# Patient Record
Sex: Female | Born: 1980 | Race: Black or African American | Hispanic: No | Marital: Single | Smoking: Former smoker
Health system: Southern US, Community
[De-identification: ages and names within clinical notes are randomized; demographics above are authoritative.]

## PROBLEM LIST (undated history)

## (undated) ENCOUNTER — Emergency Department (HOSPITAL_COMMUNITY): Payer: Self-pay

## (undated) ENCOUNTER — Ambulatory Visit (HOSPITAL_COMMUNITY): Admission: EM | Payer: Medicaid Other

## (undated) DIAGNOSIS — R600 Localized edema: Secondary | ICD-10-CM

## (undated) DIAGNOSIS — N946 Dysmenorrhea, unspecified: Secondary | ICD-10-CM

## (undated) DIAGNOSIS — R0602 Shortness of breath: Secondary | ICD-10-CM

## (undated) DIAGNOSIS — N809 Endometriosis, unspecified: Secondary | ICD-10-CM

## (undated) DIAGNOSIS — M549 Dorsalgia, unspecified: Secondary | ICD-10-CM

## (undated) DIAGNOSIS — E559 Vitamin D deficiency, unspecified: Secondary | ICD-10-CM

## (undated) DIAGNOSIS — L409 Psoriasis, unspecified: Secondary | ICD-10-CM

## (undated) DIAGNOSIS — F419 Anxiety disorder, unspecified: Secondary | ICD-10-CM

## (undated) DIAGNOSIS — K219 Gastro-esophageal reflux disease without esophagitis: Secondary | ICD-10-CM

## (undated) DIAGNOSIS — E282 Polycystic ovarian syndrome: Secondary | ICD-10-CM

## (undated) DIAGNOSIS — J45909 Unspecified asthma, uncomplicated: Secondary | ICD-10-CM

## (undated) DIAGNOSIS — G4730 Sleep apnea, unspecified: Secondary | ICD-10-CM

## (undated) DIAGNOSIS — N979 Female infertility, unspecified: Secondary | ICD-10-CM

## (undated) DIAGNOSIS — D649 Anemia, unspecified: Secondary | ICD-10-CM

## (undated) DIAGNOSIS — F32A Depression, unspecified: Secondary | ICD-10-CM

## (undated) DIAGNOSIS — E663 Overweight: Secondary | ICD-10-CM

## (undated) DIAGNOSIS — K760 Fatty (change of) liver, not elsewhere classified: Secondary | ICD-10-CM

## (undated) DIAGNOSIS — D509 Iron deficiency anemia, unspecified: Secondary | ICD-10-CM

## (undated) HISTORY — DX: Dorsalgia, unspecified: M54.9

## (undated) HISTORY — DX: Gastro-esophageal reflux disease without esophagitis: K21.9

## (undated) HISTORY — DX: Shortness of breath: R06.02

## (undated) HISTORY — DX: Psoriasis, unspecified: L40.9

## (undated) HISTORY — DX: Localized edema: R60.0

## (undated) HISTORY — DX: Vitamin D deficiency, unspecified: E55.9

## (undated) HISTORY — DX: Anxiety disorder, unspecified: F41.9

## (undated) HISTORY — DX: Overweight: E66.3

## (undated) HISTORY — DX: Anemia, unspecified: D64.9

## (undated) HISTORY — DX: Sleep apnea, unspecified: G47.30

## (undated) HISTORY — DX: Depression, unspecified: F32.A

## (undated) HISTORY — PX: TONSILLECTOMY: SUR1361

## (undated) HISTORY — DX: Fatty (change of) liver, not elsewhere classified: K76.0

## (undated) HISTORY — PX: WISDOM TOOTH EXTRACTION: SHX21

## (undated) HISTORY — DX: Female infertility, unspecified: N97.9

## (undated) HISTORY — PX: APPENDECTOMY: SHX54

---

## 2015-09-23 HISTORY — PX: LAPAROSCOPIC GASTRIC BYPASS: SUR771

## 2017-09-22 HISTORY — PX: ABLATION ON ENDOMETRIOSIS: SHX5787

## 2019-09-16 ENCOUNTER — Inpatient Hospital Stay (HOSPITAL_COMMUNITY)
Admission: EM | Admit: 2019-09-16 | Discharge: 2019-09-18 | DRG: 312 | Disposition: A | Discharge status: Home / Self Care | Payer: Medicaid Other | Attending: Internal Medicine | Admitting: Internal Medicine

## 2019-09-16 ENCOUNTER — Encounter (HOSPITAL_COMMUNITY): Payer: Self-pay | Admitting: Internal Medicine

## 2019-09-16 ENCOUNTER — Emergency Department (HOSPITAL_COMMUNITY): Payer: Medicaid Other

## 2019-09-16 ENCOUNTER — Other Ambulatory Visit: Payer: Self-pay

## 2019-09-16 DIAGNOSIS — E042 Nontoxic multinodular goiter: Secondary | ICD-10-CM | POA: Diagnosis present

## 2019-09-16 DIAGNOSIS — N809 Endometriosis, unspecified: Secondary | ICD-10-CM | POA: Diagnosis present

## 2019-09-16 DIAGNOSIS — Z87891 Personal history of nicotine dependence: Secondary | ICD-10-CM

## 2019-09-16 DIAGNOSIS — I959 Hypotension, unspecified: Secondary | ICD-10-CM

## 2019-09-16 DIAGNOSIS — E669 Obesity, unspecified: Secondary | ICD-10-CM | POA: Diagnosis present

## 2019-09-16 DIAGNOSIS — R001 Bradycardia, unspecified: Secondary | ICD-10-CM | POA: Diagnosis present

## 2019-09-16 DIAGNOSIS — E282 Polycystic ovarian syndrome: Secondary | ICD-10-CM | POA: Diagnosis present

## 2019-09-16 DIAGNOSIS — R011 Cardiac murmur, unspecified: Secondary | ICD-10-CM | POA: Diagnosis present

## 2019-09-16 DIAGNOSIS — R569 Unspecified convulsions: Secondary | ICD-10-CM | POA: Diagnosis present

## 2019-09-16 DIAGNOSIS — R0989 Other specified symptoms and signs involving the circulatory and respiratory systems: Secondary | ICD-10-CM | POA: Diagnosis present

## 2019-09-16 DIAGNOSIS — L409 Psoriasis, unspecified: Secondary | ICD-10-CM | POA: Diagnosis present

## 2019-09-16 DIAGNOSIS — J45909 Unspecified asthma, uncomplicated: Secondary | ICD-10-CM | POA: Diagnosis present

## 2019-09-16 DIAGNOSIS — R55 Syncope and collapse: Secondary | ICD-10-CM | POA: Diagnosis present

## 2019-09-16 DIAGNOSIS — Z9181 History of falling: Secondary | ICD-10-CM

## 2019-09-16 DIAGNOSIS — Z6831 Body mass index (BMI) 31.0-31.9, adult: Secondary | ICD-10-CM

## 2019-09-16 DIAGNOSIS — Z79899 Other long term (current) drug therapy: Secondary | ICD-10-CM

## 2019-09-16 DIAGNOSIS — Z20828 Contact with and (suspected) exposure to other viral communicable diseases: Secondary | ICD-10-CM | POA: Diagnosis present

## 2019-09-16 DIAGNOSIS — Z9884 Bariatric surgery status: Secondary | ICD-10-CM

## 2019-09-16 DIAGNOSIS — I951 Orthostatic hypotension: Secondary | ICD-10-CM | POA: Diagnosis present

## 2019-09-16 DIAGNOSIS — F419 Anxiety disorder, unspecified: Secondary | ICD-10-CM | POA: Diagnosis present

## 2019-09-16 HISTORY — DX: Polycystic ovarian syndrome: E28.2

## 2019-09-16 HISTORY — DX: Iron deficiency anemia, unspecified: D50.9

## 2019-09-16 HISTORY — DX: Unspecified asthma, uncomplicated: J45.909

## 2019-09-16 HISTORY — DX: Endometriosis, unspecified: N80.9

## 2019-09-16 LAB — CBC
HCT: 41.1 % (ref 36.0–46.0)
Hemoglobin: 13.7 g/dL (ref 12.0–15.0)
MCH: 31.5 pg (ref 26.0–34.0)
MCHC: 33.3 g/dL (ref 30.0–36.0)
MCV: 94.5 fL (ref 80.0–100.0)
Platelets: 221 10*3/uL (ref 150–400)
RBC: 4.35 MIL/uL (ref 3.87–5.11)
RDW: 12.8 % (ref 11.5–15.5)
WBC: 8.4 10*3/uL (ref 4.0–10.5)
nRBC: 0 % (ref 0.0–0.2)

## 2019-09-16 LAB — I-STAT CHEM 8, ED
BUN: 17 mg/dL (ref 6–20)
Calcium, Ion: 1 mmol/L — ABNORMAL LOW (ref 1.15–1.40)
Chloride: 105 mmol/L (ref 98–111)
Creatinine, Ser: 0.6 mg/dL (ref 0.44–1.00)
Glucose, Bld: 95 mg/dL (ref 70–99)
HCT: 44 % (ref 36.0–46.0)
Hemoglobin: 15 g/dL (ref 12.0–15.0)
Potassium: 3.6 mmol/L (ref 3.5–5.1)
Sodium: 138 mmol/L (ref 135–145)
TCO2: 23 mmol/L (ref 22–32)

## 2019-09-16 LAB — CBC WITH DIFFERENTIAL/PLATELET
Abs Immature Granulocytes: 0.04 10*3/uL (ref 0.00–0.07)
Basophils Absolute: 0.1 10*3/uL (ref 0.0–0.1)
Basophils Relative: 1 %
Eosinophils Absolute: 0.2 10*3/uL (ref 0.0–0.5)
Eosinophils Relative: 2 %
HCT: 42.7 % (ref 36.0–46.0)
Hemoglobin: 14.1 g/dL (ref 12.0–15.0)
Immature Granulocytes: 1 %
Lymphocytes Relative: 25 %
Lymphs Abs: 1.8 10*3/uL (ref 0.7–4.0)
MCH: 31.3 pg (ref 26.0–34.0)
MCHC: 33 g/dL (ref 30.0–36.0)
MCV: 94.9 fL (ref 80.0–100.0)
Monocytes Absolute: 1.2 10*3/uL — ABNORMAL HIGH (ref 0.1–1.0)
Monocytes Relative: 17 %
Neutro Abs: 4 10*3/uL (ref 1.7–7.7)
Neutrophils Relative %: 54 %
Platelets: 231 10*3/uL (ref 150–400)
RBC: 4.5 MIL/uL (ref 3.87–5.11)
RDW: 12.8 % (ref 11.5–15.5)
WBC: 7.3 10*3/uL (ref 4.0–10.5)
nRBC: 0 % (ref 0.0–0.2)

## 2019-09-16 LAB — COMPREHENSIVE METABOLIC PANEL
ALT: 19 U/L (ref 0–44)
AST: 35 U/L (ref 15–41)
Albumin: 3.8 g/dL (ref 3.5–5.0)
Alkaline Phosphatase: 45 U/L (ref 38–126)
Anion gap: 10 (ref 5–15)
BUN: 16 mg/dL (ref 6–20)
CO2: 23 mmol/L (ref 22–32)
Calcium: 8.3 mg/dL — ABNORMAL LOW (ref 8.9–10.3)
Chloride: 105 mmol/L (ref 98–111)
Creatinine, Ser: 0.75 mg/dL (ref 0.44–1.00)
GFR calc Af Amer: >60 mL/min (ref >60)
GFR calc non Af Amer: >60 mL/min (ref >60)
Glucose, Bld: 100 mg/dL — ABNORMAL HIGH (ref 70–99)
Potassium: 3.7 mmol/L (ref 3.5–5.1)
Sodium: 138 mmol/L (ref 135–145)
Total Bilirubin: 0.4 mg/dL (ref 0.3–1.2)
Total Protein: 6.6 g/dL (ref 6.5–8.1)

## 2019-09-16 LAB — POC SARS CORONAVIRUS 2 AG -  ED: SARS Coronavirus 2 Ag: NEGATIVE

## 2019-09-16 LAB — LACTIC ACID, PLASMA: Lactic Acid, Venous: 1.7 mmol/L (ref 0.5–1.9)

## 2019-09-16 LAB — HIV ANTIBODY (ROUTINE TESTING W REFLEX): HIV Screen 4th Generation wRfx: NONREACTIVE

## 2019-09-16 LAB — I-STAT BETA HCG BLOOD, ED (MC, WL, AP ONLY): I-stat hCG, quantitative: <5 m[IU]/mL (ref <5)

## 2019-09-16 LAB — HEMOGLOBIN A1C
Hgb A1c MFr Bld: 4.9 % (ref 4.8–5.6)
Mean Plasma Glucose: 93.93 mg/dL

## 2019-09-16 LAB — TSH: TSH: 1.457 u[IU]/mL (ref 0.350–4.500)

## 2019-09-16 MED ORDER — ACETAMINOPHEN 650 MG RE SUPP
650.0000 mg | Freq: Four times a day (QID) | RECTAL | Status: DC | PRN
  Administered 2019-09-18: 650 mg via RECTAL

## 2019-09-16 MED ORDER — ENOXAPARIN SODIUM 40 MG/0.4ML ~~LOC~~ SOLN
40.0000 mg | SUBCUTANEOUS | Status: DC
  Administered 2019-09-16 – 2019-09-17 (×2): 40 mg via SUBCUTANEOUS
  Filled 2019-09-16 (×2): qty 0.4

## 2019-09-16 MED ORDER — ACETAMINOPHEN 325 MG PO TABS
650.0000 mg | ORAL_TABLET | Freq: Four times a day (QID) | ORAL | Status: DC | PRN
  Administered 2019-09-17: 650 mg via ORAL
  Filled 2019-09-16 (×2): qty 2

## 2019-09-16 MED ORDER — HYDROCORTISONE NA SUCCINATE PF 100 MG IJ SOLR
100.0000 mg | Freq: Every day | INTRAMUSCULAR | Status: DC
  Administered 2019-09-16: 100 mg via INTRAVENOUS
  Filled 2019-09-16: qty 2

## 2019-09-16 MED ORDER — EPINEPHRINE HCL 5 MG/250ML IV SOLN IN NS
0.5000 ug/min | INTRAVENOUS | Status: DC
  Filled 2019-09-16: qty 250

## 2019-09-16 MED ORDER — FENTANYL CITRATE (PF) 100 MCG/2ML IJ SOLN
25.0000 ug | Freq: Once | INTRAMUSCULAR | Status: AC
  Administered 2019-09-16: 25 ug via INTRAVENOUS
  Filled 2019-09-16: qty 2

## 2019-09-16 MED ORDER — SODIUM CHLORIDE 0.9 % IV BOLUS
2000.0000 mL | Freq: Once | INTRAVENOUS | Status: AC
  Administered 2019-09-16: 2000 mL via INTRAVENOUS

## 2019-09-16 MED ORDER — SODIUM CHLORIDE 0.9 % IV SOLN: INTRAVENOUS | Status: DC

## 2019-09-16 NOTE — ED Triage Notes (Signed)
Pt bib ems from home with syncopal events X2. States she feels chronically dehydrated. No palpable bp or radial on scene. HR 40SB on scene. 1L NS and epi drip initiate. Epi drip going at 60mcg/min. Pt alert and oriented on arrival.

## 2019-09-16 NOTE — ED Notes (Signed)
Report given to 3E RN. All questions answered 

## 2019-09-16 NOTE — ED Notes (Signed)
Pt returned from imaging. States when she sits up she becomes dizzy.

## 2019-09-16 NOTE — ED Notes (Signed)
Updated pt's significant other over video chat
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
## 2019-09-16 NOTE — ED Notes (Signed)
Aspen collar applied due to cervical pain

## 2019-09-16 NOTE — ED Provider Notes (Addendum)
[facility] EMERGENCY DEPARTMENT Provider Note   CSN: [id number] Arrival date & time: 09/16/19  1619     History No chief complaint on file.   Stacey Ramos is a 38 y.o. female.  38 year old female who presents with weakness and syncope.  Patient states that she has had decreased oral intake.  Denies any vomiting or diarrhea.  No abdominal chest discomfort.  No recent fever or chills.  No cough or congestion.  No history of blood loss.  Does have a history of prior episodes of hypotension due to volume depletion.  This is as result of prior bariatric surgery.  Patient with a significant lightheaded and dizzy and passed out x2.  Complains of some neck pain at this time but denies any peripheral numbness or weakness.  EMS called and arrived to find patient very hypotensive.  No recent history of steroid use.  Gave patient a liter of saline with minimal response to to her blood pressure.  Started an epinephrine drip.  And patient transported        No past medical history on file.  There are no problems to display for this patient.      OB History   No obstetric history on file.     No family history on file.  Social History   Tobacco Use  . Smoking status: Not on file  Substance Use Topics  . Alcohol use: Not on file  . Drug use: Not on file    Home Medications Prior to Admission medications   Not on File    Allergies    Patient has no known allergies.  Review of Systems   Review of Systems  All other systems reviewed and are negative.   Physical Exam Updated Vital Signs BP (!) 104/49   Pulse (!) 47   Resp 15   SpO2 94%   Physical Exam Vitals and nursing note reviewed.  Constitutional:      General: She is not in acute distress.    Appearance: Normal appearance. She is well-developed. She is not toxic-appearing.  HENT:     Head: Normocephalic and atraumatic.  Eyes:     General: Lids are normal.     Conjunctiva/sclera:  Conjunctivae normal.     Pupils: Pupils are equal, round, and reactive to light.  Neck:     Thyroid: No thyroid mass.     Trachea: No tracheal deviation.  Cardiovascular:     Rate and Rhythm: Normal rate and regular rhythm.     Heart sounds: Normal heart sounds. No murmur. No gallop.   Pulmonary:     Effort: Pulmonary effort is normal. No respiratory distress.     Breath sounds: Normal breath sounds. No stridor. No decreased breath sounds, wheezing, rhonchi or rales.  Abdominal:     General: Bowel sounds are normal. There is no distension.     Palpations: Abdomen is soft.     Tenderness: There is no abdominal tenderness. There is no rebound.  Musculoskeletal:        General: No tenderness. Normal range of motion.     Cervical back: Normal range of motion and neck supple.  Skin:    General: Skin is warm and dry.     Findings: No abrasion or rash.  Neurological:     Mental Status: She is alert and oriented to person, place, and time.     GCS: GCS eye subscore is 4. GCS verbal subscore is 5. GCS motor subscore is  6.     Cranial Nerves: No cranial nerve deficit.     Sensory: No sensory deficit.  Psychiatric:        Speech: Speech normal.        Behavior: Behavior normal.     ED Results / Procedures / Treatments   Labs (all labs ordered are listed, but only abnormal results are displayed) Labs Reviewed  CULTURE, BLOOD (ROUTINE X 2)  CULTURE, BLOOD (ROUTINE X 2)  URINE CULTURE  CBC WITH DIFFERENTIAL/PLATELET  COMPREHENSIVE METABOLIC PANEL  LACTIC ACID, PLASMA  URINALYSIS, ROUTINE W REFLEX MICROSCOPIC  TSH  I-STAT BETA HCG BLOOD, ED ([facility], [facility], [facility] ONLY)  POC SARS CORONAVIRUS 2 AG -  ED    EKG EKG Interpretation  Date/Time:  Friday September 16 2019 16:33:03 EST Ventricular Rate:  46 PR Interval:    QRS Duration: 82 QT Interval:  435 QTC Calculation: 381 R Axis:   18 Text Interpretation: Sinus bradycardia Low voltage, precordial leads No old tracing to compare Confirmed  by Lacretia Leigh ([id number]) on 09/16/2019 5:59:50 PM   Radiology No results found.  Procedures Procedures (including critical care time)  Medications Ordered in ED Medications  sodium chloride 0.9 % bolus 2,000 mL (has no administration in time range)  0.9 %  sodium chloride infusion (has no administration in time range)    ED Course  I have reviewed the triage vital signs and the nursing notes.  Pertinent labs & imaging results that were available during my care of the patient were reviewed by me and considered in my medical decision making (see chart for details).    MDM Rules/Calculators/A&P                      Patient presented initially on epinephrine drip and this was able to be weaned off after patient given 2 L of saline and given a dose of Solu-Cortef..  She however feels symptomatic of dizziness when she tries to sit up.  Because of her falls and trauma she had a CT of the head neck which were negative for fracture.  Patient laboratory studies without significant abnormalities.  This included normal renal function as well as no signs of severe anemia.  Chest x-ray without acute findings.  Bedside Covid test negative.  Patient will be admitted for further evaluation   CRITICAL CARE Performed by: Leota Jacobsen Total critical care time: 60 minutes Critical care time was exclusive of separately billable procedures and treating other patients. Critical care was necessary to treat or prevent imminent or life-threatening deterioration. Critical care was time spent personally by me on the following activities: development of treatment plan with patient and/or surrogate as well as nursing, discussions with consultants, evaluation of patient's response to treatment, examination of patient, obtaining history from patient or surrogate, ordering and performing treatments and interventions, ordering and review of laboratory studies, ordering and review of radiographic studies, pulse  oximetry and re-evaluation of patient's condition.  Final Clinical Impression(s) / ED Diagnoses Final diagnoses:  None    Rx / DC Orders ED Discharge Orders    None       Lacretia Leigh, MD 09/16/19 1843    Lacretia Leigh, MD 09/26/19 1055

## 2019-09-17 ENCOUNTER — Encounter (HOSPITAL_COMMUNITY): Payer: Self-pay | Admitting: Internal Medicine

## 2019-09-17 ENCOUNTER — Encounter (HOSPITAL_COMMUNITY): Payer: Self-pay

## 2019-09-17 ENCOUNTER — Observation Stay (HOSPITAL_BASED_OUTPATIENT_CLINIC_OR_DEPARTMENT_OTHER): Payer: Medicaid Other

## 2019-09-17 DIAGNOSIS — R55 Syncope and collapse: Secondary | ICD-10-CM | POA: Diagnosis present

## 2019-09-17 DIAGNOSIS — R569 Unspecified convulsions: Secondary | ICD-10-CM | POA: Diagnosis present

## 2019-09-17 DIAGNOSIS — J45909 Unspecified asthma, uncomplicated: Secondary | ICD-10-CM | POA: Diagnosis present

## 2019-09-17 DIAGNOSIS — Z87891 Personal history of nicotine dependence: Secondary | ICD-10-CM

## 2019-09-17 DIAGNOSIS — L409 Psoriasis, unspecified: Secondary | ICD-10-CM

## 2019-09-17 DIAGNOSIS — N920 Excessive and frequent menstruation with regular cycle: Secondary | ICD-10-CM

## 2019-09-17 DIAGNOSIS — E282 Polycystic ovarian syndrome: Secondary | ICD-10-CM | POA: Diagnosis present

## 2019-09-17 DIAGNOSIS — I951 Orthostatic hypotension: Secondary | ICD-10-CM | POA: Diagnosis present

## 2019-09-17 DIAGNOSIS — R011 Cardiac murmur, unspecified: Secondary | ICD-10-CM

## 2019-09-17 DIAGNOSIS — I361 Nonrheumatic tricuspid (valve) insufficiency: Secondary | ICD-10-CM

## 2019-09-17 DIAGNOSIS — N809 Endometriosis, unspecified: Secondary | ICD-10-CM | POA: Diagnosis present

## 2019-09-17 DIAGNOSIS — R0989 Other specified symptoms and signs involving the circulatory and respiratory systems: Secondary | ICD-10-CM

## 2019-09-17 DIAGNOSIS — Z9181 History of falling: Secondary | ICD-10-CM | POA: Diagnosis not present

## 2019-09-17 DIAGNOSIS — R001 Bradycardia, unspecified: Secondary | ICD-10-CM | POA: Diagnosis present

## 2019-09-17 DIAGNOSIS — E041 Nontoxic single thyroid nodule: Secondary | ICD-10-CM

## 2019-09-17 DIAGNOSIS — E042 Nontoxic multinodular goiter: Secondary | ICD-10-CM | POA: Diagnosis present

## 2019-09-17 DIAGNOSIS — Z79899 Other long term (current) drug therapy: Secondary | ICD-10-CM | POA: Diagnosis not present

## 2019-09-17 DIAGNOSIS — F419 Anxiety disorder, unspecified: Secondary | ICD-10-CM | POA: Diagnosis present

## 2019-09-17 DIAGNOSIS — Z20828 Contact with and (suspected) exposure to other viral communicable diseases: Secondary | ICD-10-CM | POA: Diagnosis present

## 2019-09-17 DIAGNOSIS — Z6831 Body mass index (BMI) 31.0-31.9, adult: Secondary | ICD-10-CM | POA: Diagnosis not present

## 2019-09-17 DIAGNOSIS — Z9884 Bariatric surgery status: Secondary | ICD-10-CM | POA: Diagnosis not present

## 2019-09-17 DIAGNOSIS — E669 Obesity, unspecified: Secondary | ICD-10-CM | POA: Diagnosis present

## 2019-09-17 DIAGNOSIS — D500 Iron deficiency anemia secondary to blood loss (chronic): Secondary | ICD-10-CM

## 2019-09-17 HISTORY — DX: Orthostatic hypotension: I95.1

## 2019-09-17 LAB — URINALYSIS, ROUTINE W REFLEX MICROSCOPIC
Bacteria, UA: NONE SEEN
Bilirubin Urine: NEGATIVE
Glucose, UA: NEGATIVE mg/dL
Hgb urine dipstick: NEGATIVE
Ketones, ur: NEGATIVE mg/dL
Nitrite: NEGATIVE
Protein, ur: NEGATIVE mg/dL
Specific Gravity, Urine: 1.012 (ref 1.005–1.030)
pH: 6 (ref 5.0–8.0)

## 2019-09-17 LAB — ECHOCARDIOGRAM COMPLETE
Height: 64 in
Weight: 2841.6 oz

## 2019-09-17 LAB — CBC
HCT: 40.2 % (ref 36.0–46.0)
Hemoglobin: 13.3 g/dL (ref 12.0–15.0)
MCH: 30.9 pg (ref 26.0–34.0)
MCHC: 33.1 g/dL (ref 30.0–36.0)
MCV: 93.5 fL (ref 80.0–100.0)
Platelets: 236 10*3/uL (ref 150–400)
RBC: 4.3 MIL/uL (ref 3.87–5.11)
RDW: 12.9 % (ref 11.5–15.5)
WBC: 7.4 10*3/uL (ref 4.0–10.5)
nRBC: 0 % (ref 0.0–0.2)

## 2019-09-17 LAB — CORTISOL-AM, BLOOD: Cortisol - AM: 3.4 ug/dL — ABNORMAL LOW (ref 6.7–22.6)

## 2019-09-17 LAB — BASIC METABOLIC PANEL
Anion gap: 7 (ref 5–15)
BUN: 12 mg/dL (ref 6–20)
CO2: 24 mmol/L (ref 22–32)
Calcium: 8.5 mg/dL — ABNORMAL LOW (ref 8.9–10.3)
Chloride: 110 mmol/L (ref 98–111)
Creatinine, Ser: 0.49 mg/dL (ref 0.44–1.00)
GFR calc Af Amer: >60 mL/min (ref >60)
GFR calc non Af Amer: >60 mL/min (ref >60)
Glucose, Bld: 95 mg/dL (ref 70–99)
Potassium: 3.7 mmol/L (ref 3.5–5.1)
Sodium: 141 mmol/L (ref 135–145)

## 2019-09-17 LAB — SARS CORONAVIRUS 2 (TAT 6-24 HRS): SARS Coronavirus 2: NEGATIVE

## 2019-09-17 LAB — CORTISOL: Cortisol, Plasma: 3.4 ug/dL

## 2019-09-17 LAB — VITAMIN B12: Vitamin B-12: 705 pg/mL (ref 180–914)

## 2019-09-17 MED ORDER — SODIUM CHLORIDE 0.9 % IV BOLUS: 1000.0000 mL | Freq: Once | INTRAVENOUS | Status: DC

## 2019-09-17 MED ORDER — SODIUM CHLORIDE 0.9 % IV BOLUS
2000.0000 mL | Freq: Once | INTRAVENOUS | Status: AC
  Administered 2019-09-17: 2000 mL via INTRAVENOUS

## 2019-09-17 MED ORDER — POLYETHYLENE GLYCOL 3350 17 G PO PACK
17.0000 g | PACK | Freq: Every day | ORAL | Status: DC
  Administered 2019-09-17 – 2019-09-18 (×2): 17 g via ORAL
  Filled 2019-09-17 (×2): qty 1

## 2019-09-17 MED ORDER — MIDODRINE HCL 5 MG PO TABS
10.0000 mg | ORAL_TABLET | Freq: Three times a day (TID) | ORAL | Status: DC
  Administered 2019-09-18: 10 mg via ORAL
  Filled 2019-09-17 (×2): qty 2

## 2019-09-17 NOTE — Discharge Instructions (Signed)
Hypotension °As your heart beats, it forces blood through your body. This force is called blood pressure. If you have hypotension, you have low blood pressure. When your blood pressure is too low, you may not get enough blood to your brain or other parts of your body. This may cause you to feel weak, light-headed, have a fast heartbeat, or even pass out (faint). Low blood pressure may be harmless, or it may cause serious problems. °What are the causes? °· Blood loss. °· Not enough water in the body (dehydration). °· Heart problems. °· Hormone problems. °· Pregnancy. °· A very bad infection. °· Not having enough of certain nutrients. °· Very bad allergic reactions. °· Certain medicines. °What increases the risk? °· Age. The risk increases as you get older. °· Conditions that affect the heart or the brain and spinal cord (central nervous system). °· Taking certain medicines. °· Being pregnant. °What are the signs or symptoms? °· Feeling: °? Weak. °? Light-headed. °? Dizzy. °? Tired (fatigued). °· Blurred vision. °· Fast heartbeat. °· Passing out, in very bad cases. °How is this treated? °· Changing your diet. This may involve eating more salt (sodium) or drinking more water. °· Taking medicines to raise your blood pressure. °· Changing how much you take (the dosage) of some of your medicines. °· Wearing compression stockings. These stockings help to prevent blood clots and reduce swelling in your legs. °In some cases, you may need to go to the hospital for: °· Fluid replacement. This means you will receive fluids through an IV tube. °· Blood replacement. This means you will receive donated blood through an IV tube (transfusion). °· Treating an infection or heart problems, if this applies. °· Monitoring. You may need to be monitored while medicines that you are taking wear off. °Follow these instructions at home: °Eating and drinking ° °· Drink enough fluids to keep your pee (urine) pale yellow. °· Eat a healthy diet.  Follow instructions from your doctor about what you can eat or drink. A healthy diet includes: °? Fresh fruits and vegetables. °? Whole grains. °? Low-fat (lean) meats. °? Low-fat dairy products. °· Eat extra salt only as told. Do not add extra salt to your diet unless your doctor tells you to. °· Eat small meals often. °· Avoid standing up quickly after you eat. °Medicines °· Take over-the-counter and prescription medicines only as told by your doctor. °? Follow instructions from your doctor about changing how much you take of your medicines, if this applies. °? Do not stop or change any of your medicines on your own. °General instructions ° °· Wear compression stockings as told by your doctor. °· Get up slowly from lying down or sitting. °· Avoid hot showers and a lot of heat as told by your doctor. °· Return to your normal activities as told by your doctor. Ask what activities are safe for you. °· Do not use any products that contain nicotine or tobacco, such as cigarettes, e-cigarettes, and chewing tobacco. If you need help quitting, ask your doctor. °· Keep all follow-up visits as told by your doctor. This is important. °Contact a doctor if: °· You throw up (vomit). °· You have watery poop (diarrhea). °· You have a fever for more than 2-3 days. °· You feel more thirsty than normal. °· You feel weak and tired. °Get help right away if: °· You have chest pain. °· You have a fast or uneven heartbeat. °· You lose feeling (have numbness) in any   part of your body. °· You cannot move your arms or your legs. °· You have trouble talking. °· You get sweaty or feel light-headed. °· You pass out. °· You have trouble breathing. °· You have trouble staying awake. °· You feel mixed up (confused). °Summary °· Hypotension is also called low blood pressure. It is when the force of blood pumping through your arteries is too weak. °· Hypotension may be harmless, or it may cause serious problems. °· Treatment may include changing  your diet and medicines, and wearing compression stockings. °· In very bad cases, you may need to go to the hospital. °This information is not intended to replace advice given to you by your health care provider. Make sure you discuss any questions you have with your health care provider. °Document Released: 12/03/2009 Document Revised: 03/04/2018 Document Reviewed: 03/04/2018 °Elsevier Patient Education © 2020 [employer] ° °

## 2019-09-17 NOTE — Progress Notes (Signed)
Orthostatic vital signs were obtained, pt became dizzy with leg weakness when doing so.   Therefore patient has not yet had her O2 measured with ambulation.   She is currently off the floor for US/Echo, will re-assess dizziness when she returns.   Tele notified of pt off the floor.

## 2019-09-17 NOTE — Plan of Care (Signed)

## 2019-09-17 NOTE — Progress Notes (Signed)
Carotid artery duplex completed. Refer to "CV Proc" under chart review to view preliminary results.  09/17/2019 12:04 PM Kelby Aline., MHA, RVT, RDCS, RDMS

## 2019-09-17 NOTE — Progress Notes (Addendum)
Messaged by physical therapist regarding concern for discharge. Examined and evaluated [name] at bedside with physical therapist present. Mentions that she has been feeling dizzy and was unable to walk short distance from bed to bathroom with physical therapy. She states she is continuing to feel light-headed. She mentions that she feel similar to her presenting symptoms. During conversation, she mentions having another episode of light-headedness without vertigo and dizziness and had to laid down flat.  Blood pressure 112/68, pulse 67, temperature 98 F (36.7 C), resp. rate 18, height 5\' 4"  (1.626 m), weight 80.6 kg, SpO2 100 %. Gen: Well-developed, well nourished, ill-appearing HEENT: NCAT head, hearing intact, EOMI, MMM Neck: supple, ROM intact, no JVD CV: RRR, S1, S2 normal, No rubs, no murmurs, no gallops Pulm: CTAB, No rales, no wheezes Extm: ROM intact, Peripheral pulses intact, No peripheral edema Skin: Dry, Warm, normal turgor, no rashes, lesions, wounds.  Neuro: AAOx3 Psych: Normal mood and affect  Per physical therapy, had episode of symptomatic orthostatic hypotension after standing for 2 min with bp of 93/80. Current bp wnl while sitting upright. Unfortunately had tele off in preparation for discharge. Unable to assess heart rate during event. Likely due to inadequate fluid resuscitation. Will re-assess after fluid bolus.  Re-examined [name] at bedside after 1L bolus. She mentions that she continues to feel somewhat light-headed although not as severe as with prior episode. She mentions that Stacey Ramos saw her and recommended that she stays overnight. Discussed reasoning for 1 more hospital stay to ensure safety and perform additional work-up. [name] expressed understanding and is agreeable to the plan. All other questions and concerns addressed.

## 2019-09-17 NOTE — Progress Notes (Signed)
Patient resting comfortably during shift report. Denies complaints.  

## 2019-09-17 NOTE — Progress Notes (Signed)
After evaluation by PT, MD re-evaluated patient, discharge is being postponed at least until 2 liter NS bolus can be administered and pt re-assessed.   IV access replaced, Bolus initiated.

## 2019-09-17 NOTE — Progress Notes (Signed)
Subjective: HD#0 Events Overnight: Admitted to hospital  Patient was seen this morning on rounds. Patient states that she was sitting at the kitchen table with her boyfriend when her symptoms started. She states that the room began to spin and she was feeling dizzy. Her boyfriend observed that she was diaphoretic and white as a piece of paper. She states that her boyfriend had to restrain her because she appeared to seizing. She states that she went to her room and fell and hit the front of her head. She then had a second fall where she hit the back of her head. She states that she sat in front of her room until EMS came.   Her usual episodes start when she begins to get tunnel vision. Shortly after her tunnel vision, her knees get week, and she begins to get anxious, then she blacks out. These episodes happen suddenly.   She has a history of thyroid nodules, psoriasis, and a gastric sleeve. Additionally, she has seen a cardiologist, but not for her bradycardia. She saw a cardiologist for edema to rule out CHF.   Patient does endorse feeling dizzy, but denies the room spinning.   Objective:  Vital signs in last 24 hours: Vitals:   09/16/19 2300 09/16/19 2353 09/17/19 0324 09/17/19 0743  BP: 108/70 (!) 115/53 119/71 105/67  Pulse: 69 66 (!) 53 63  Resp: 19 18 17 18   Temp:  98.2 F (36.8 C) 97.7 F (36.5 C) 98.4 F (36.9 C)  TempSrc:  Oral Oral Oral  SpO2: 98% 100% 100% 100%  Weight:  80.6 kg    Height:  5\' 4"  (1.626 m)      Physical Exam: Physical Exam  Constitutional: She is oriented to person, place, and time. She appears distressed (due to dizziness).  HENT:  Head: Normocephalic and atraumatic.  Mouth/Throat: Uvula is midline, oropharynx is clear and moist and mucous membranes are normal. No oral lesions. Normal dentition. No lacerations.  Eyes: EOM are normal.  Cardiovascular: Normal rate, regular rhythm and intact distal pulses. Exam reveals no gallop and no friction  rub.  No murmur heard. Pulmonary/Chest: Effort normal and breath sounds normal. No respiratory distress. She exhibits no tenderness.  Abdominal: Soft. Bowel sounds are normal. She exhibits no distension. There is no abdominal tenderness.  Musculoskeletal:        General: No tenderness or edema. Normal range of motion.     Cervical back: Normal range of motion.  Neurological: She is alert and oriented to person, place, and time.  Skin: Skin is warm and dry. She is not diaphoretic.    Filed Weights   09/16/19 2353  Weight: 80.6 kg     Intake/Output Summary (Last 24 hours) at 09/17/2019 0945 Last data filed at 09/17/2019 0900 Gross per 24 hour  Intake 1860 ml  Output 600 ml  Net 1260 ml    Pertinent labs/Imaging: CBC Latest Ref Rng & Units 09/17/2019 09/16/2019 09/16/2019  WBC 4.0 - 10.5 K/uL 7.4 8.4 -  Hemoglobin 12.0 - 15.0 g/dL 13.3 13.7 15.0  Hematocrit 36.0 - 46.0 % 40.2 41.1 44.0  Platelets 150 - 400 K/uL 236 221 -    CMP Latest Ref Rng & Units 09/17/2019 09/16/2019 09/16/2019  Glucose 70 - 99 mg/dL 95 95 100(H)  BUN 6 - 20 mg/dL 12 17 16   Creatinine 0.44 - 1.00 mg/dL 0.49 0.60 0.75  Sodium 135 - 145 mmol/L 141 138 138  Potassium 3.5 - 5.1 mmol/L 3.7 3.6 3.7  Chloride 98 - 111 mmol/L 110 105 105  CO2 22 - 32 mmol/L 24 - 23  Calcium 8.9 - 10.3 mg/dL 8.5(L) - 8.3(L)  Total Protein 6.5 - 8.1 g/dL - - 6.6  Total Bilirubin 0.3 - 1.2 mg/dL - - 0.4  Alkaline Phos 38 - 126 U/L - - 45  AST 15 - 41 U/L - - 35  ALT 0 - 44 U/L - - 19    CT Head Wo Contrast  Result Date: 09/16/2019 CLINICAL DATA:  Syncopal episodes x2. Fall with facial trauma, headache and neck pain. EXAM: CT HEAD WITHOUT CONTRAST CT CERVICAL SPINE WITHOUT CONTRAST TECHNIQUE: Multidetector CT imaging of the head and cervical spine was performed following the standard protocol without intravenous contrast. Multiplanar CT image reconstructions of the cervical spine were also generated. COMPARISON:  None.  FINDINGS: CT HEAD FINDINGS Despite efforts by the technologist and patient, mild motion artifact is present on today's exam and could not be eliminated. This reduces exam sensitivity and specificity. Some images were repeated. Brain: There is no evidence of acute intracranial hemorrhage, mass lesion, brain edema or extra-axial fluid collection. The ventricles and subarachnoid spaces are appropriately sized for age. There is no CT evidence of acute cortical infarction. Vascular:  No hyperdense vessel identified. Skull: Negative for fracture or focal lesion. Sinuses/Orbits: Bilateral maxillary and ethmoid sinus mucosal thickening with possible layering fluid in the left maxillary sinus. No visible facial fractures. The additional visualized paranasal sinuses, mastoid air cells and middle ears are clear. No orbital abnormalities. Other: None. CT CERVICAL SPINE FINDINGS Alignment: Normal. Skull base and vertebrae: No evidence of acute fracture or traumatic subluxation. Soft tissues and spinal canal: No prevertebral fluid or swelling. No visible canal hematoma. Disc levels: The disc heights are maintained. No large disc herniation or significant foraminal compromise. Upper chest: No significant findings. Minimal heterogeneity of the thyroid gland without suspicious nodularity. Other: None. IMPRESSION: 1. No acute intracranial or calvarial findings. 2. No evidence of acute cervical spine fracture, traumatic subluxation or static signs of instability. 3. Bilateral maxillary and ethmoid sinus mucosal thickening with possible layering fluid in the left maxillary sinus. Electronically Signed   By: Richardean Sale M.D.   On: 09/16/2019 18:11   CT Cervical Spine Wo Contrast  Result Date: 09/16/2019 CLINICAL DATA:  Syncopal episodes x2. Fall with facial trauma, headache and neck pain. EXAM: CT HEAD WITHOUT CONTRAST CT CERVICAL SPINE WITHOUT CONTRAST TECHNIQUE: Multidetector CT imaging of the head and cervical spine was  performed following the standard protocol without intravenous contrast. Multiplanar CT image reconstructions of the cervical spine were also generated. COMPARISON:  None. FINDINGS: CT HEAD FINDINGS Despite efforts by the technologist and patient, mild motion artifact is present on today's exam and could not be eliminated. This reduces exam sensitivity and specificity. Some images were repeated. Brain: There is no evidence of acute intracranial hemorrhage, mass lesion, brain edema or extra-axial fluid collection. The ventricles and subarachnoid spaces are appropriately sized for age. There is no CT evidence of acute cortical infarction. Vascular:  No hyperdense vessel identified. Skull: Negative for fracture or focal lesion. Sinuses/Orbits: Bilateral maxillary and ethmoid sinus mucosal thickening with possible layering fluid in the left maxillary sinus. No visible facial fractures. The additional visualized paranasal sinuses, mastoid air cells and middle ears are clear. No orbital abnormalities. Other: None. CT CERVICAL SPINE FINDINGS Alignment: Normal. Skull base and vertebrae: No evidence of acute fracture or traumatic subluxation. Soft tissues and spinal canal: No prevertebral fluid or  swelling. No visible canal hematoma. Disc levels: The disc heights are maintained. No large disc herniation or significant foraminal compromise. Upper chest: No significant findings. Minimal heterogeneity of the thyroid gland without suspicious nodularity. Other: None. IMPRESSION: 1. No acute intracranial or calvarial findings. 2. No evidence of acute cervical spine fracture, traumatic subluxation or static signs of instability. 3. Bilateral maxillary and ethmoid sinus mucosal thickening with possible layering fluid in the left maxillary sinus. Electronically Signed   By: Richardean Sale M.D.   On: 09/16/2019 18:11   DG Chest Port 1 View  Result Date: 09/16/2019 CLINICAL DATA:  Shortness of breath EXAM: PORTABLE CHEST 1 VIEW  COMPARISON:  Portable exam 1805 hours without priors for comparison. FINDINGS: Normal heart size, mediastinal contours, and pulmonary vascularity. Lungs clear. No pulmonary infiltrate, pleural effusion, or pneumothorax. Osseous structures unremarkable. IMPRESSION: No acute abnormalities. Electronically Signed   By: Lavonia Dana M.D.   On: 09/16/2019 18:15    Assessment/Plan:  Active Problems:   Syncope   Patient Summary: Stacey Ramos is a 38 y.o. with pertinent PMH of PCOS, endometriosis, iron deficiency anemia, asthma, psoriasis, history of thyroid nodule, admit for syncope on hospital day 0   #Syncope: #Syncope induced convulsion:  - Orthostatic vitals are normal  - Echo pending - PT ambulation with pulse check - TSH normal - Will consult cardiology pending results of the echo. - She will likely need cardiology outpatient follow up loop recorder  #Sinus bradycardia - Continue telemetry  - Not only any medications that suppress SA node.  - UDS not performed on admission, admits to previous marijuana usage  #Thyroid nodules - TSH normal  #Carotid bruits: - Carotid US pending   Diet: regular IVF: NS 125 mL/hr VTE: Enoxaparin  Code: Full  Dispo: Anticipated discharge tomorrow.    Marianna Payment, D.O. [facility], PGY-1 Date 09/17/2019 Time 9:45 AM Pager: [phone]

## 2019-09-17 NOTE — Evaluation (Addendum)
Physical Therapy Evaluation & Discharge Patient Details Name: Stacey Ramos MRN: [id number] DOB: 02-28-1981 Today's Date: 09/17/2019   History of Present Illness  Pt is a 38 y.o. female admitted 09/16/19 after syncopal episode at home, boyfriend witnessed the episode and told pt she was "convulsing." Found to be bradycardic and hypotensive. Head/cervical CT negative for acute abnormality. (+) orthostatic hypotension. PMH includes gastric bypass sx (~2 yrs ago), multiple concussions, PCOS, anemia, endometriosis, asthma.    Clinical Impression  Patient evaluated by Physical Therapy with no further acute PT needs identified. 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 Today, pt moving independently, although mobility significantly limited by symptomatic orthostatic hypotension (see values below). This apparently also happened earlier today with RN/NT. Educ re: strategies to combat signs/symptoms of orthostatic hypotension, fall risk reduction. Pt preparing to d/c home; messaged MD regarding concern of pt discharging with current status, MD to come evaluate. Acute PT is signing off. Thank you for this referral.  Orthostatic BPs Sitting 117/72  Standing 102/65  Standing after 2 min 93/80  Post-mobility 109/66   HR 63, SpO2 99% on RA    Follow Up Recommendations No PT follow up;Supervision - Intermittent    Equipment Recommendations  None recommended by PT    Recommendations for Other Services       Precautions / Restrictions Precautions Precautions: Fall Precaution Comments: (+) orthostatic hypotension, symptomatic Restrictions Weight Bearing Restrictions: No      Mobility  Bed Mobility Overal bed mobility: Independent                Transfers Overall transfer level: Independent Equipment used: None                Ambulation/Gait Ambulation/Gait assistance: Supervision Gait Distance (Feet): 24  Feet Assistive device: None     Gait velocity interpretation: 1.31 - 2.62 ft/sec, indicative of limited community ambulator General Gait Details: Amb to/from bathroom, pt moving independently; supervision for safety due to symptomatic orthostatic hypotension  Stairs            Wheelchair Mobility    Modified Rankin (Stroke Patients Only)       Balance Overall balance assessment: No apparent balance deficits (not formally assessed)                                           Pertinent Vitals/Pain Pain Assessment: Faces Faces Pain Scale: Hurts a little bit Pain Location: Head Pain Descriptors / Indicators: Sore;Headache Pain Intervention(s): Monitored during session    Home Living Family/patient expects to be discharged to:: Private residence Living Arrangements: Parent;Children;Non-relatives/Friends Available Help at Discharge: Family;Available PRN/intermittently Type of Home: House       Home Layout: One level   Additional Comments: Lives with significant other, daughter and father; 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 daughter     Hand Dominance        Extremity/Trunk Assessment   Upper Extremity Assessment Upper Extremity Assessment: Overall WFL for tasks assessed    Lower Extremity Assessment Lower Extremity Assessment: Overall WFL for tasks assessed    Cervical / Trunk Assessment Cervical / Trunk Assessment: Normal(h/o  gastric bypass sx ~2 yrs ago)  Communication   Communication: No difficulties  Cognition Arousal/Alertness: Awake/alert Behavior During Therapy: WFL for tasks assessed/performed Overall Cognitive Status: Within Functional Limits for tasks assessed                                         General Comments General comments (skin integrity, edema, etc.): Educ re: strategies to combat signs/symptoms of orthostatic hypotension, fall risk reduction, concussion/mild TBI handout. Pt preparing to d/c home, MD secure chatted regarding pt's symptomatic (+) orthostatic hypotension and unsafe to d/c; MD to come evaluate    Exercises     Assessment/Plan    PT Assessment Patent does not need any further PT services  PT Problem List         PT Treatment Interventions      PT Goals (Current goals can be found in the Care Plan section)  Acute Rehab PT Goals PT Goal Formulation: All assessment and education complete, DC therapy    Frequency     Barriers to discharge        Co-evaluation               AM-PAC PT "6 Clicks" Mobility  Outcome Measure Help needed turning from your back to your side while in a flat bed without using bedrails?: None Help needed moving from lying on your back to sitting on the side of a flat bed without using bedrails?: None Help needed moving to and from a bed to a chair (including a wheelchair)?: None Help needed standing up from a chair using your arms (e.g., wheelchair or bedside chair)?: None Help needed to walk in hospital room?: None Help needed climbing 3-5 steps with a railing? : None 6 Click Score: 24    End of Session   Activity Tolerance: Patient tolerated treatment well;Treatment limited secondary to medical complications (Comment) Patient left: in bed;with call bell/phone within reach;with family/visitor present Nurse Communication: Mobility status PT Visit Diagnosis: Other abnormalities of gait and mobility (R26.89)    Time: 1426-1450 PT Time Calculation (min) (ACUTE ONLY): 24 min   Charges:   PT Evaluation $PT Eval Low Complexity: 1 Low PT Treatments $Self Care/Home Management: 8-22   Mabeline Caras, PT, DPT Acute Rehabilitation Services  Pager [phone] Office [place] 09/17/2019, 3:13  PM

## 2019-09-17 NOTE — Progress Notes (Signed)
  Echocardiogram 2D Echocardiogram has been performed.  Stacey Ramos 09/17/2019, 12:09 PM

## 2019-09-17 NOTE — Consult Note (Signed)
CARDIOLOGY CONSULT NOTE  Patient ID: Stacey Ramos MRN: [id number] DOB/AGE: [birth year] 38 y.o.  Admit date: 09/16/2019 Referring Physician  Gilberto Better, MD Primary Physician:  Patient, No Pcp Per Reason for Consultation  Syncope  Patient ID: Stacey Ramos, female    DOB: 1981-03-13, 38 y.o.   MRN: [id number]  CC: Syncope HPI:    Stacey Ramos  is a 38 y.o. with morbid obesity, history of gastric bypass surgery in January 2019 and has lost 165 pounds since then, had one episode of syncope sometime in December 2019/January 2020 when she suddenly got up and was trying to walk and fell and hit her head.  No recurrence since then.  Yesterday while sitting at lunchtime, complained to her husband about not feeling well and feeling very faint and dizzy and also diaphoretic.  She tried to get up to go and lay down, then had frank episode of syncope and her husband did catch her and made her sit on a chair.  He thought that she had convulsions.  Patient regained consciousness right away, there is no post ictal state.  She said she feels like she is going to pass out again and not feel well.  He advised her to go and rest in the bedroom, she got up and was walking in the hallway when she felt to the ground and hit her head.  This was followed by third episode of syncope when she had gone to the bedroom and wanted to lay down but wanted to go to the bathroom first, fell and hit her head again.  Patient was extremely weak and fatigued and dizzy, eventually husband made her lay down on the bed and called 911.  She is now admitted for further evaluation.  She feels well presently, physical therapy try to walk her this afternoon and she dropped her blood pressure and also felt markedly dizzy and hence discharge was canceled and placed back on IV fluids.  I was consulted to evaluate patient with syncope.  She has been doing well and has not had any dizziness prior to the episode.  Remains active, exercise  regularly, has maintained weight loss.  No chest pain, no dyspnea.  He does wear an abdominal vest to prevent shagginess of her skin since weight loss.  She has had nasal congestion, cough and also itchy throat for the past 4 to 5 days.  Past Medical History:  Diagnosis Date  . Asthma   . Endometriosis   . Iron deficiency anemia   . Polycystic ovarian syndrome    Past Surgical History:  Procedure Laterality Date  . BARIATRIC SURGERY  2017  . ENDOMETRIAL ABLATION  2019   Social History   Socioeconomic History  . Marital status: Single    Spouse name: Not on file  . Number of children: Not on file  . Years of education: Not on file  . Highest education level: Not on file  Occupational History  . Not on file  Tobacco Use  . Smoking status: Former Smoker    Types: Cigarettes  . Smokeless tobacco: Never Used  Substance and Sexual Activity  . Alcohol use: Not Currently  . Drug use: Not Currently  . Sexual activity: Not on file  Other Topics Concern  . Not on file  Social History Narrative  . Not on file   Social Determinants of Health   Financial Resource Strain:   . Difficulty of Paying Living Expenses: Not on file  Food Insecurity:   .  Worried About Charity fundraiser in the Last Year: Not on file  . Ran Out of Food in the Last Year: Not on file  Transportation Needs:   . Lack of Transportation (Medical): Not on file  . Lack of Transportation (Non-Medical): Not on file  Physical Activity:   . Days of Exercise per Week: Not on file  . Minutes of Exercise per Session: Not on file  Stress:   . Feeling of Stress : Not on file  Social Connections:   . Frequency of Communication with Friends and Family: Not on file  . Frequency of Social Gatherings with Friends and Family: Not on file  . Attends Religious Services: Not on file  . Active Member of Clubs or Organizations: Not on file  . Attends Archivist Meetings: Not on file  . Marital Status: Not on file   Intimate Partner Violence:   . Fear of Current or Ex-Partner: Not on file  . Emotionally Abused: Not on file  . Physically Abused: Not on file  . Sexually Abused: Not on file   ROS  Review of Systems  Constitution: Negative for chills, decreased appetite, malaise/fatigue and weight gain.  Cardiovascular: Negative for dyspnea on exertion, leg swelling and syncope.  Respiratory: Positive for cough (4 days).   Endocrine: Negative for cold intolerance.  Hematologic/Lymphatic: Does not bruise/bleed easily.  Musculoskeletal: Negative for joint swelling.  Gastrointestinal: Negative for abdominal pain, anorexia, change in bowel habit, hematochezia and melena.  Neurological: Positive for dizziness and light-headedness. Negative for headaches.  Psychiatric/Behavioral: Negative for depression and substance abuse.  All other systems reviewed and are negative.  Objective   Vitals with BMI 09/17/2019 09/17/2019 09/17/2019  Height - - -  Weight - - -  BMI - - -  Systolic [id number]  Diastolic 68 67 71  Pulse 67 63 53    Blood pressure 112/68, pulse 67, temperature 98 F (36.7 C), resp. rate 18, height 5\' 4"  (1.626 m), weight 80.6 kg, SpO2 100 %.    Orthostatic VS for the past 24 hrs (Last 3 readings):  BP- Lying Pulse- Lying BP- Sitting Pulse- Sitting BP- Standing at 0 minutes Pulse- Standing at 0 minutes  09/17/19 1001 102/67 72 93/62 79 103/75 100    Physical Exam  Constitutional:  She is moderately built and overweight but in no acute distress.  HENT:  Head: Atraumatic.  Eyes: Conjunctivae are normal.  Neck: No JVD present. No thyromegaly present.  Cardiovascular: Normal rate, regular rhythm, normal heart sounds and intact distal pulses. Exam reveals no gallop.  No murmur heard. No leg edema, no JVD.  Pulmonary/Chest: Effort normal and breath sounds normal.  Abdominal: Soft. Bowel sounds are normal.  Musculoskeletal:        General: Normal range of motion.     Cervical back:  Neck supple.  Neurological: She is alert.  Skin: Skin is warm and dry.  Psychiatric: She has a normal mood and affect.   Laboratory examination:    Recent Labs    09/16/19 1700 09/16/19 1722 09/17/19 0624  NA 138 138 141  K 3.7 3.6 3.7  CL 105 105 110  CO2 23  --  24  GLUCOSE 100* 95 95  BUN 16 17 12   CREATININE 0.75 0.60 0.49  CALCIUM 8.3*  --  8.5*  GFRNONAA >60  --  >60  GFRAA >60  --  >60   estimated creatinine clearance is 98 mL/min (by C-G formula based on  SCr of 0.49 mg/dL).  CMP Latest Ref Rng & Units 09/17/2019 09/16/2019 09/16/2019  Glucose 70 - 99 mg/dL 95 95 100(H)  BUN 6 - 20 mg/dL 12 17 16   Creatinine 0.44 - 1.00 mg/dL 0.49 0.60 0.75  Sodium 135 - 145 mmol/L 141 138 138  Potassium 3.5 - 5.1 mmol/L 3.7 3.6 3.7  Chloride 98 - 111 mmol/L 110 105 105  CO2 22 - 32 mmol/L 24 - 23  Calcium 8.9 - 10.3 mg/dL 8.5(L) - 8.3(L)  Total Protein 6.5 - 8.1 g/dL - - 6.6  Total Bilirubin 0.3 - 1.2 mg/dL - - 0.4  Alkaline Phos 38 - 126 U/L - - 45  AST 15 - 41 U/L - - 35  ALT 0 - 44 U/L - - 19   CBC Latest Ref Rng & Units 09/17/2019 09/16/2019 09/16/2019  WBC 4.0 - 10.5 K/uL 7.4 8.4 -  Hemoglobin 12.0 - 15.0 g/dL 13.3 13.7 15.0  Hematocrit 36.0 - 46.0 % 40.2 41.1 44.0  Platelets 150 - 400 K/uL 236 221 -   Lipid Panel  No results found for: CHOL, TRIG, HDL, CHOLHDL, VLDL, LDLCALC, LDLDIRECT HEMOGLOBIN A1C Lab Results  Component Value Date   HGBA1C 4.9 09/16/2019   MPG 93.93 09/16/2019   TSH Recent Labs    09/16/19 1700  TSH 1.457   BNP (last 3 results) No results for input(s): BNP in the last 8760 hours.  Medications and allergies  No Known Allergies   Prior to Admission medications   Medication Sig Start Date End Date Taking? Authorizing Provider  acetaminophen (TYLENOL) 325 MG tablet Take 650 mg by mouth every 6 (six) hours as needed for moderate pain.   Yes [provider]  BIOTIN PO Take 1 tablet by mouth every other day.   Yes [provider]  Calcium Carbonate Antacid (ANTACID PO) Take 2 tablets by mouth daily as needed.   Yes [provider]  CALCIUM CITRATE PO Take 1 tablet by mouth 3 (three) times daily.   Yes [provider]  Cyanocobalamin (VITAMIN B12 PO) Take 1 tablet by mouth daily.   Yes [provider]  Multiple Vitamins-Minerals (AIRBORNE GUMMIES) CHEW Chew 4 tablets by mouth 3 (three) times daily.   Yes [provider]  Pediatric Multivitamins-Iron (FLINTSTONES COMPLETE PO) Take 2 tablets by mouth daily.   Yes [provider]    . sodium chloride 125 mL/hr at 09/16/19 1900    Current Outpatient Medications  Medication Instructions  . acetaminophen (TYLENOL) 650 mg, Oral, Every 6 hours PRN  . BIOTIN PO 1 tablet, Oral, Every other day  . Calcium Carbonate Antacid (ANTACID PO) 2 tablets, Oral, Daily PRN  . CALCIUM CITRATE PO 1 tablet, Oral, 3 times daily  . Cyanocobalamin (VITAMIN B12 PO) 1 tablet, Oral, Daily  . Multiple Vitamins-Minerals (AIRBORNE GUMMIES) CHEW 4 tablets, Oral, 3 times daily  . Pediatric Multivitamins-Iron (FLINTSTONES COMPLETE PO) 2 tablets, Oral, Daily    I/O last 3 completed shifts: In: 1620 [P.O.:560; I.V.:1060] Out: 600 [Urine:600] Total I/O In: 960 [P.O.:960] Out: 1000 [Urine:1000]   Radiology:   Imaging: CT Head Wo Contrast  Result Date: 09/16/2019 CLINICAL DATA:  Syncopal episodes x2. Fall with facial trauma, headache and neck pain. EXAM: CT HEAD WITHOUT CONTRAST CT CERVICAL SPINE WITHOUT CONTRAST TECHNIQUE: Multidetector CT imaging of the head and cervical spine was performed following the standard protocol without intravenous contrast. Multiplanar CT image reconstructions of the cervical spine were also generated. COMPARISON:  None.  FINDINGS: CT HEAD FINDINGS Despite efforts by the technologist and patient, mild motion artifact is present on today's exam and could not be eliminated. This reduces exam sensitivity and  specificity. Some images were repeated. Brain: There is no evidence of acute intracranial hemorrhage, mass lesion, brain edema or extra-axial fluid collection. The ventricles and subarachnoid spaces are appropriately sized for age. There is no CT evidence of acute cortical infarction. Vascular:  No hyperdense vessel identified. Skull: Negative for fracture or focal lesion. Sinuses/Orbits: Bilateral maxillary and ethmoid sinus mucosal thickening with possible layering fluid in the left maxillary sinus. No visible facial fractures. The additional visualized paranasal sinuses, mastoid air cells and middle ears are clear. No orbital abnormalities. Other: None. CT CERVICAL SPINE FINDINGS Alignment: Normal. Skull base and vertebrae: No evidence of acute fracture or traumatic subluxation. Soft tissues and spinal canal: No prevertebral fluid or swelling. No visible canal hematoma. Disc levels: The disc heights are maintained. No large disc herniation or significant foraminal compromise. Upper chest: No significant findings. Minimal heterogeneity of the thyroid gland without suspicious nodularity. Other: None. IMPRESSION: 1. No acute intracranial or calvarial findings. 2. No evidence of acute cervical spine fracture, traumatic subluxation or static signs of instability. 3. Bilateral maxillary and ethmoid sinus mucosal thickening with possible layering fluid in the left maxillary sinus. Electronically Signed   By: Richardean Sale M.D.   On: 09/16/2019 18:11   CT Cervical Spine Wo Contrast  Result Date: 09/16/2019 CLINICAL DATA:  Syncopal episodes x2. Fall with facial trauma, headache and neck pain. EXAM: CT HEAD WITHOUT CONTRAST CT CERVICAL SPINE WITHOUT CONTRAST TECHNIQUE: Multidetector CT imaging of the head and cervical spine was performed following the standard protocol without intravenous contrast. Multiplanar CT image reconstructions of the cervical spine were also generated. COMPARISON:  None. FINDINGS: CT HEAD  FINDINGS Despite efforts by the technologist and patient, mild motion artifact is present on today's exam and could not be eliminated. This reduces exam sensitivity and specificity. Some images were repeated. Brain: There is no evidence of acute intracranial hemorrhage, mass lesion, brain edema or extra-axial fluid collection. The ventricles and subarachnoid spaces are appropriately sized for age. There is no CT evidence of acute cortical infarction. Vascular:  No hyperdense vessel identified. Skull: Negative for fracture or focal lesion. Sinuses/Orbits: Bilateral maxillary and ethmoid sinus mucosal thickening with possible layering fluid in the left maxillary sinus. No visible facial fractures. The additional visualized paranasal sinuses, mastoid air cells and middle ears are clear. No orbital abnormalities. Other: None. CT CERVICAL SPINE FINDINGS Alignment: Normal. Skull base and vertebrae: No evidence of acute fracture or traumatic subluxation. Soft tissues and spinal canal: No prevertebral fluid or swelling. No visible canal hematoma. Disc levels: The disc heights are maintained. No large disc herniation or significant foraminal compromise. Upper chest: No significant findings. Minimal heterogeneity of the thyroid gland without suspicious nodularity. Other: None. IMPRESSION: 1. No acute intracranial or calvarial findings. 2. No evidence of acute cervical spine fracture, traumatic subluxation or static signs of instability. 3. Bilateral maxillary and ethmoid sinus mucosal thickening with possible layering fluid in the left maxillary sinus. Electronically Signed   By: Richardean Sale M.D.   On: 09/16/2019 18:11   DG Chest Port 1 View  Result Date: 09/16/2019 CLINICAL DATA:  Shortness of breath EXAM: PORTABLE CHEST 1 VIEW COMPARISON:  Portable exam 1805 hours without priors for comparison. FINDINGS: Normal heart size, mediastinal contours, and pulmonary vascularity. Lungs clear. No pulmonary infiltrate,  pleural effusion, or pneumothorax. Osseous  structures unremarkable. IMPRESSION: No acute abnormalities. Electronically Signed   By: Lavonia Dana M.D.   On: 09/16/2019 18:15   ECHOCARDIOGRAM COMPLETE  Result Date: 09/17/2019   ECHOCARDIOGRAM REPORT   Patient Name:   LANDREY BERNAL Date of Exam: 09/17/2019 Medical Rec #:  [id number]        Height:       64.0 in Accession #:    [id number]       Weight:       177.6 lb Date of Birth:  [birth year]        BSA:          1.86 m Patient Age:    36 years         BP:           105/67 mmHg Patient Gender: F                HR:           47 bpm. Exam Location:  Inpatient Procedure: 2D Echo Indications:    Syncope R55  History:        Patient has prior history of Echocardiogram examinations. Risk                 Factors:Former Smoker.  Sonographer:    Mikki Santee RDCS (AE) Referring Phys: [id number] [place]  1. Left ventricular ejection fraction, by visual estimation, is 60 to 65%. The left ventricle has normal function. There is no left ventricular hypertrophy.  2. The left ventricle has no regional wall motion abnormalities.  3. Global right ventricle has normal systolic function.The right ventricular size is normal. No increase in right ventricular wall thickness.  4. Left atrial size was normal.  5. Right atrial size was normal.  6. The mitral valve is normal in structure. No evidence of mitral valve regurgitation. No evidence of mitral stenosis.  7. The tricuspid valve is normal in structure.  8. The aortic valve is normal in structure. Aortic valve regurgitation is not visualized. No evidence of aortic valve sclerosis or stenosis.  9. The pulmonic valve was normal in structure. Pulmonic valve regurgitation is not visualized. 10. Normal pulmonary artery systolic pressure. 11. The inferior vena cava is dilated in size with >50% respiratory variability, suggesting right atrial pressure of 8 mmHg. FINDINGS  Left Ventricle: Left ventricular ejection  fraction, by visual estimation, is 60 to 65%. The left ventricle has normal function. The left ventricle has no regional wall motion abnormalities. There is no left ventricular hypertrophy. Left ventricular diastolic parameters were normal. Normal left atrial pressure. Right Ventricle: The right ventricular size is normal. No increase in right ventricular wall thickness. Global RV systolic function is has normal systolic function. The tricuspid regurgitant velocity is 2.00 m/s, and with an assumed right atrial pressure  of 8 mmHg, the estimated right ventricular systolic pressure is normal at 24.1 mmHg. Left Atrium: Left atrial size was normal in size. Right Atrium: Right atrial size was normal in size Pericardium: There is no evidence of pericardial effusion. Mitral Valve: The mitral valve is normal in structure. No evidence of mitral valve regurgitation. No evidence of mitral valve stenosis by observation. Tricuspid Valve: The tricuspid valve is normal in structure. Tricuspid valve regurgitation is mild. Aortic Valve: The aortic valve is normal in structure. Aortic valve regurgitation is not visualized. The aortic valve is structurally normal, with no evidence of sclerosis or stenosis. Pulmonic Valve: The pulmonic valve was normal in structure. Pulmonic valve regurgitation is  not visualized. Pulmonic regurgitation is not visualized. Aorta: The aortic root, ascending aorta and aortic arch are all structurally normal, with no evidence of dilitation or obstruction. Venous: The inferior vena cava is dilated in size with greater than 50% respiratory variability, suggesting right atrial pressure of 8 mmHg. IAS/Shunts: No atrial level shunt detected by color flow Doppler. There is no evidence of a patent foramen ovale. No ventricular septal defect is seen or detected. There is no evidence of an atrial septal defect.  LEFT VENTRICLE PLAX 2D LVIDd:         5.10 cm  Diastology LVIDs:         3.30 cm  LV e' lateral:   10.30  cm/s LV PW:         0.80 cm  LV E/e' lateral: 7.8 LV IVS:        0.70 cm  LV e' medial:    10.60 cm/s LVOT diam:     2.00 cm  LV E/e' medial:  7.6 LV SV:         80 ml LV SV Index:   41.19 LVOT Area:     3.14 cm  RIGHT VENTRICLE RV S prime:     7.94 cm/s TAPSE (M-mode): 1.8 cm LEFT ATRIUM             Index       RIGHT ATRIUM          Index LA diam:        3.50 cm 1.88 cm/m  RA Area:     9.47 cm LA Vol (A2C):   44.5 ml 23.92 ml/m RA Volume:   17.70 ml 9.52 ml/m LA Vol (A4C):   20.5 ml 11.02 ml/m LA Biplane Vol: 32.0 ml 17.20 ml/m  AORTIC VALVE LVOT Vmax:   97.50 cm/s LVOT Vmean:  66.500 cm/s LVOT VTI:    0.219 m  AORTA Ao Root diam: 2.60 cm MITRAL VALVE                        TRICUSPID VALVE MV Area (PHT): 2.01 cm             TR Peak grad:   16.1 mmHg MV PHT:        109.33 msec          TR Vmax:        211.00 cm/s MV Decel Time: 377 msec MV E velocity: 80.60 cm/s 103 cm/s  SHUNTS MV A velocity: 60.00 cm/s 70.3 cm/s Systemic VTI:  0.22 m MV E/A ratio:  1.34       1.5       Systemic Diam: 2.00 cm  Dani Gobble Croitoru MD Electronically signed by Sanda Klein MD Signature Date/Time: 09/17/2019/12:11:21 PM    Final    VAS US CAROTID  Result Date: 09/17/2019 Carotid Arterial Duplex Study Indications: Syncope. Performing Technologist: Maudry Mayhew MHA, RDMS, RVT, RDCS  Examination Guidelines: A complete evaluation includes B-mode imaging, spectral Doppler, color Doppler, and power Doppler as needed of all accessible portions of each vessel. Bilateral testing is considered an integral part of a complete examination. Limited examinations for reoccurring indications may be performed as noted.  Right Carotid Findings: +----------+--------+--------+--------+------------------+--------+           PSV cm/sEDV cm/sStenosisPlaque DescriptionComments +----------+--------+--------+--------+------------------+--------+ CCA Prox  79      14                                          +----------+--------+--------+--------+------------------+--------+  CCA Distal81      20                                         +----------+--------+--------+--------+------------------+--------+ ICA Prox  71      21                                         +----------+--------+--------+--------+------------------+--------+ ICA Distal82      28                                         +----------+--------+--------+--------+------------------+--------+ ECA       99      9                                          +----------+--------+--------+--------+------------------+--------+ +----------+--------+-------+----------------+-------------------+           PSV cm/sEDV cmsDescribe        Arm Pressure (mmHG) +----------+--------+-------+----------------+-------------------+ [id number]            Multiphasic, WNL                    +----------+--------+-------+----------------+-------------------+ +---------+--------+--+--------+--+---------+ VertebralPSV cm/s41EDV cm/s11Antegrade +---------+--------+--+--------+--+---------+  Left Carotid Findings: +----------+--------+--------+--------+------------------+--------+           PSV cm/sEDV cm/sStenosisPlaque DescriptionComments +----------+--------+--------+--------+------------------+--------+ CCA Prox  148     29                                         +----------+--------+--------+--------+------------------+--------+ CCA Distal131     30                                         +----------+--------+--------+--------+------------------+--------+ ICA Prox  104     27                                         +----------+--------+--------+--------+------------------+--------+ ICA Distal96      34                                         +----------+--------+--------+--------+------------------+--------+ ECA       169     23                                          +----------+--------+--------+--------+------------------+--------+ +----------+--------+--------+----------------+-------------------+           PSV cm/sEDV cm/sDescribe        Arm Pressure (mmHG) +----------+--------+--------+----------------+-------------------+ Subclavian130             Multiphasic, WNL                    +----------+--------+--------+----------------+-------------------+ +---------+--------+--+--------+--+---------+  VertebralPSV cm/s72EDV cm/s23Antegrade +---------+--------+--+--------+--+---------+  Summary: Right Carotid: Velocities in the right ICA are consistent with a 1-39% stenosis. Left Carotid: Velocities in the left ICA are consistent with a 1-39% stenosis. Vertebrals:  Bilateral vertebral arteries demonstrate antegrade flow. Subclavians: Normal flow hemodynamics were seen in bilateral subclavian              arteries. *See table(s) above for measurements and observations.  Electronically signed by Monica Martinez MD on 09/17/2019 at 2:45:04 PM.    Final     Cardiac Studies:   Echocardiogram 09/17/2019:   Normal LVEF, essentially normal echocardiogram.  Assessment   1.  Syncope secondary to orthostatic hypotension.  Although 1 set of vital signs revealing no orthostasis, patient has clinically demonstrated drop in blood pressure when she stands up. 2.  History of gastric bypass surgery, weight loss of 165 pounds since January 2019. 3.  Recent upper respiratory infection, COVID-19 screening negative.  Still has cough and itchy throat and nasal congestion. 4.  Asymptomatic sinus bradycardia.  EKG 09/16/2019: Marked sinus bradycardia at rate of 46 bpm low-voltage complexes, otherwise normal EKG.  Recommendations:  I will send for serum cortisol now and also recheck in the morning.  We will check orthostatics.  Once this is done I will start her on midodrine if orthostasis is confirmed.  She may benefit from being on fludrocortisone as well. Awaiting  orthostasis and also checking serum cortisol level prior to starting fludrocortisone.  Midodrine will not affect this, if orthostasis is proven, will start her at 10 mg 3 times daily while awake.  Etiology for her syndrome of orthostasis could be related to recent upper respiratory infection, or it could be idiopathic as well.  Adrian Prows, MD, [facility] 09/17/2019, 3:45 PM [employer] [facility] Pager: [phone] Office: [phone]

## 2019-09-17 NOTE — Progress Notes (Signed)
Call placed to pharmacy to verify if midodrine 5mg  tablets have any sort of coating, per pt she is unable to take medications with a coating due to hx of gastric bypass.   Awaiting callback to confirm.  Page to MD re orthostatic VS.  3e30. [place]. OrthostaticVS charted, please call RN.  09/17/2019 [place] confirms as  midodrine is not coated.  Per MD, hold Midodrine, allow patient to ambulate some. Recheck orthostatics in the morning, and re-address.  Currently Midodrine order still exists, MAR is marked on hold through tomorrow. RN for 12/27 will need to [facility] "hold" to administer if warranted/advised by Dr Einar Gip.

## 2019-09-17 NOTE — Progress Notes (Signed)
Pt refused to turn on bed alarm, stated will call when need to get up, will continue monitor

## 2019-09-17 NOTE — Progress Notes (Signed)
Patient to vascular ultrasound with transport.

## 2019-09-17 NOTE — Discharge Summary (Signed)
Name: Stacey Ramos MRN: [id number] DOB: 1981-05-26 38 y.o. PCP: Patient, No Pcp Per  Date of Admission: 09/16/2019  4:19 PM Date of Discharge: 09/18/2019 Attending Physician: Velna Ochs, MD  Discharge Diagnosis: 1. Syncope likely secondary to orthostatic hypotension   Discharge Medications: Allergies as of 09/18/2019   No Known Allergies     Medication List    TAKE these medications   acetaminophen 325 MG tablet Commonly known as: TYLENOL Take 650 mg by mouth every 6 (six) hours as needed for moderate pain.   Airborne Gummies Google 4 tablets by mouth 3 (three) times daily.   ANTACID PO Take 2 tablets by mouth daily as needed.   BIOTIN PO Take 1 tablet by mouth every other day.   CALCIUM CITRATE PO Take 1 tablet by mouth 3 (three) times daily.   FLINTSTONES COMPLETE PO Take 2 tablets by mouth daily.   midodrine 10 MG tablet Commonly known as: PROAMATINE Take 1 tablet (10 mg total) by mouth 3 (three) times daily with meals. Start taking on: September 19, 2019   VITAMIN B12 PO Take 1 tablet by mouth daily.       Disposition and follow-up:   Ms.Jakiah Dickhaut was discharged from [facility] in Good condition.  At the hospital follow up visit please address:  1.  Follow-up: Make sure patient is taking her midodrine and following up with cardiology   2.  Labs / imaging needed at time of follow-up: likely will need work up for neurogenic orthostatic hypotension.   3.  Pending labs/ test needing follow-up: none  Follow-up Appointments: Follow-up Information    Adrian Prows, MD. Call on 09/19/2019.   Specialty: Cardiology Why: Please coll the Cardiology office aon monday to make an appointment.  Contact information: [place] [address] [phone]        Adrian Prows, MD .   Specialty: Cardiology Contact information: [place] [id number] [place] Hospital Course by problem list: 1. Syncope/Orthostatic Hypotension Patient was admitted on 12/25 after a witness syncopal episode. Patient admits to lightheadedness and vertigo prior to syncope, which has resulted in several falls. Orthostatic vitals positive and EKG showed sinus brady. Patient able to increase pulse with ambulation. Fluid resuscitation given. She continued to have these symptoms in the hospital while trying to sit up to eat dinner. Telemetry showed no new arrhythmias during these episodes. Cardiology consulted and agreed with likely orthostatic hypotension. Patient started on midodrine and discharged with close follow up with cardiology.   Discharge Vitals:   BP 106/61 (BP Location: Left Arm) Comment: sitting  Pulse (!) 51   Temp 98.7 F (37.1 C) (Oral)   Resp 14   Ht 5\' 4"  (1.626 m)   Wt 82.3 kg Comment: scale b  SpO2 100%   BMI 31.14 kg/m   Pertinent Labs, Studies, and Procedures:  CBC Latest Ref Rng & Units 09/18/2019 09/17/2019 09/17/2019  WBC 4.0 - 10.5 K/uL 6.8 7.4 -  Hemoglobin 12.0 - 15.0 g/dL 12.8 13.3 -  Hematocrit 36.0 - 46.0 % 39.9 40.2 39.9  Platelets 150 - 400 K/uL 237 236 -   CMP Latest Ref Rng & Units 09/18/2019 09/17/2019 09/16/2019  Glucose 70 - 99 mg/dL 86 95 95  BUN 6 - 20 mg/dL 7 12 17   Creatinine 0.44 - 1.00 mg/dL 0.59 0.49 0.60  Sodium 135 - 145  mmol/L 139 141 138  Potassium 3.5 - 5.1 mmol/L 4.3 3.7 3.6  Chloride 98 - 111 mmol/L 108 110 105  CO2 22 - 32 mmol/L 25 24 -  Calcium 8.9 - 10.3 mg/dL 8.4(L) 8.5(L) -  Total Protein 6.5 - 8.1 g/dL 5.4(L) - -  Total Bilirubin 0.3 - 1.2 mg/dL 0.3 - -  Alkaline Phos 38 - 126 U/L 39 - -  AST 15 - 41 U/L 25 - -  ALT 0 - 44 U/L 17 - -     Discharge Instructions: Discharge Instructions    Diet - low sodium heart healthy   Complete by: As directed    Diet - low sodium heart healthy   Complete by: As directed    Discharge instructions   Complete by: As directed    You were hospitalized for  Syncope. Thank you for allowing [place] to be part of your care.   We arranged for you to follow up at:   1) Dr. Einar Gip (Cardiology) - please call his office on Monday 12/28 to make a follow up appointment.   Please note these changes made to your medications:   Please START taking: none   Please STOP taking: none   Please make sure to: 1) Follow up with cardiology 2) Do not drive until talking with your Cardiologist.   Please call our clinic if you have any questions or concerns, we may be able to help and keep you from a long and expensive emergency room wait. Our clinic and after hours phone number is [phone], the best time to call is Monday through Friday 9 am to 4 pm but there is always someone available 24/7 if you have an emergency. If you need medication refills please notify your pharmacy one week in advance and they will send [place] a request.   Discharge instructions   Complete by: As directed    You were hospitalized for syncope. Thank you for allowing [place] to be part of your care.   We arranged for you to follow up at: Dr. Einar Gip with cardiology   Please note these changes made to your medications:   Please START taking:  1) midodrine -see discharge instructions for prescription details  Please STOP taking: none   Please make sure to follow up with the cardiologist.   Please call our clinic if you have any questions or concerns, we may be able to help and keep you from a long and expensive emergency room wait. Our clinic and after hours phone number is [phone], the best time to call is Monday through Friday 9 am to 4 pm but there is always someone available 24/7 if you have an emergency. If you need medication refills please notify your pharmacy one week in advance and they will send [place] a request.   Increase activity slowly   Complete by: As directed    Increase activity slowly   Complete by: As directed       Signed: Marianna Payment, MD 09/18/2019, 4:24 PM     Pager: [phone]

## 2019-09-17 NOTE — Progress Notes (Signed)
Call placed to [facility] to notify of telemetry monitoring d/c.   

## 2019-09-17 NOTE — Care Management (Addendum)
Acknowledge consult for follow up and possible medication needs. TOC will follow and assess final medication needs at time of discharge for [facility].   1:20 Spoke w patient over the phone, we will call to get f/u apt Monday and call her with time. No medication needs identified.

## 2019-09-17 NOTE — Progress Notes (Signed)
Patient's discharge has been canceled.   Tele replaced.  [place] continued.

## 2019-09-18 DIAGNOSIS — Z8639 Personal history of other endocrine, nutritional and metabolic disease: Secondary | ICD-10-CM

## 2019-09-18 DIAGNOSIS — I951 Orthostatic hypotension: Principal | ICD-10-CM

## 2019-09-18 LAB — COMPREHENSIVE METABOLIC PANEL
ALT: 17 U/L (ref 0–44)
AST: 25 U/L (ref 15–41)
Albumin: 3 g/dL — ABNORMAL LOW (ref 3.5–5.0)
Alkaline Phosphatase: 39 U/L (ref 38–126)
Anion gap: 6 (ref 5–15)
BUN: 7 mg/dL (ref 6–20)
CO2: 25 mmol/L (ref 22–32)
Calcium: 8.4 mg/dL — ABNORMAL LOW (ref 8.9–10.3)
Chloride: 108 mmol/L (ref 98–111)
Creatinine, Ser: 0.59 mg/dL (ref 0.44–1.00)
GFR calc Af Amer: >60 mL/min (ref >60)
GFR calc non Af Amer: >60 mL/min (ref >60)
Glucose, Bld: 86 mg/dL (ref 70–99)
Potassium: 4.3 mmol/L (ref 3.5–5.1)
Sodium: 139 mmol/L (ref 135–145)
Total Bilirubin: 0.3 mg/dL (ref 0.3–1.2)
Total Protein: 5.4 g/dL — ABNORMAL LOW (ref 6.5–8.1)

## 2019-09-18 LAB — FOLATE RBC
Folate, Hemolysate: 429 ng/mL
Folate, RBC: 1075 ng/mL (ref >498)
Hematocrit: 39.9 % (ref 34.0–46.6)

## 2019-09-18 LAB — CBC
HCT: 39.9 % (ref 36.0–46.0)
Hemoglobin: 12.8 g/dL (ref 12.0–15.0)
MCH: 30.9 pg (ref 26.0–34.0)
MCHC: 32.1 g/dL (ref 30.0–36.0)
MCV: 96.4 fL (ref 80.0–100.0)
Platelets: 237 10*3/uL (ref 150–400)
RBC: 4.14 MIL/uL (ref 3.87–5.11)
RDW: 13.3 % (ref 11.5–15.5)
WBC: 6.8 10*3/uL (ref 4.0–10.5)
nRBC: 0 % (ref 0.0–0.2)

## 2019-09-18 LAB — URINE CULTURE: Culture: <10000 — AB

## 2019-09-18 LAB — CORTISOL-AM, BLOOD: Cortisol - AM: 14.9 ug/dL (ref 6.7–22.6)

## 2019-09-18 MED ORDER — SODIUM CHLORIDE 0.9 % IV SOLN: INTRAVENOUS | Status: AC

## 2019-09-18 MED ORDER — MIDODRINE HCL 10 MG PO TABS: 10.0000 mg | ORAL_TABLET | Freq: Three times a day (TID) | ORAL | 1 refills | Status: AC

## 2019-09-18 NOTE — Progress Notes (Signed)
Stacey Ramos to be D/C'd Home per MD order.  Discussed with the patient and all questions fully answered.  VSS, Skin clean, dry and intact without evidence of skin break down, no evidence of skin tears noted.  IV catheter discontinued intact. Site without signs and symptoms of complications. Dressing and pressure applied.  An After Visit Summary was printed and given to the patient. Patient received prescription.  D/c education completed with patient/family including follow up instructions, medication list, d/c activities limitations if indicated, with other d/c instructions as indicated by MD - patient able to verbalize understanding, all questions fully answered.   Patient instructed to return to ED, call 911, or call MD for any changes in condition.   Patient escorted via [place], and D/C home via private auto.  Howard Pouch 09/18/2019 9:08 PM

## 2019-09-18 NOTE — Progress Notes (Addendum)
Per secure chat message from [place] at [phone], administer midodrine.   Paged [place] at [phone] to discuss plan of care. Upon callback, informed Cardio to see patient and to administer fluids. RN informed plan to give midodrine, allow patient to eat then ambulate and assess VS as ordered.  Paged [place] [phone]. Upon callback Marianna Payment confirmed reviewed orthostatic VS and ok to discharge.

## 2019-09-18 NOTE — Progress Notes (Addendum)
Subjective: HD#1 Events Overnight: Syncopal episode yesterday evening.  Patient was seen this morning on rounds.  Patient states that she has had 2 episodes of lightheadedness and vertigo this morning. Telemetry was checked and showed stable sinus bradycardia.  She states that the symptoms occurred while elevating the head of her bed to eat breakfast.  She states that she feels like the blood rushing out of her head, like the room is spinning around her and as if she is going to pass out.   Objective:  Vital signs in last 24 hours: Vitals:   09/17/19 1252 09/17/19 1918 09/18/19 0041 09/18/19 0232  BP: 112/68 (!) 103/52 (!) 102/56   Pulse: 67 65 60   Resp: 18 17 16    Temp: 98 F (36.7 C) 98.4 F (36.9 C) 97.8 F (36.6 C)   TempSrc:  Oral Oral   SpO2: 100% 99% 98%   Weight:    82.3 kg  Height:        Physical Exam: Physical Exam  Constitutional: She is oriented to person, place, and time. No distress.  HENT:  Head: Normocephalic and atraumatic.  Eyes: EOM are normal.  Cardiovascular: Regular rhythm, normal heart sounds and intact distal pulses. Bradycardia present. Exam reveals no gallop and no friction rub.  No murmur heard. Pulmonary/Chest: Effort normal and breath sounds normal. No respiratory distress. She exhibits no tenderness.  Abdominal: Soft. Bowel sounds are normal. She exhibits no distension. There is no abdominal tenderness.  Musculoskeletal:        General: No tenderness or edema. Normal range of motion.     Cervical back: Normal range of motion.  Neurological: She is alert and oriented to person, place, and time.  Skin: Skin is warm and dry. She is not diaphoretic.    Filed Weights   09/16/19 2353 09/18/19 0232  Weight: 80.6 kg 82.3 kg     Intake/Output Summary (Last 24 hours) at 09/18/2019 0521 Last data filed at 09/18/2019 0240 Gross per 24 hour  Intake 2876.37 ml  Output 2400 ml  Net 476.37 ml    Pertinent labs/Imaging: CBC Latest Ref Rng &  Units 09/17/2019 09/16/2019 09/16/2019  WBC 4.0 - 10.5 K/uL 7.4 8.4 -  Hemoglobin 12.0 - 15.0 g/dL 13.3 13.7 15.0  Hematocrit 36.0 - 46.0 % 40.2 41.1 44.0  Platelets 150 - 400 K/uL 236 221 -    CMP Latest Ref Rng & Units 09/17/2019 09/16/2019 09/16/2019  Glucose 70 - 99 mg/dL 95 95 100(H)  BUN 6 - 20 mg/dL 12 17 16   Creatinine 0.44 - 1.00 mg/dL 0.49 0.60 0.75  Sodium 135 - 145 mmol/L 141 138 138  Potassium 3.5 - 5.1 mmol/L 3.7 3.6 3.7  Chloride 98 - 111 mmol/L 110 105 105  CO2 22 - 32 mmol/L 24 - 23  Calcium 8.9 - 10.3 mg/dL 8.5(L) - 8.3(L)  Total Protein 6.5 - 8.1 g/dL - - 6.6  Total Bilirubin 0.3 - 1.2 mg/dL - - 0.4  Alkaline Phos 38 - 126 U/L - - 45  AST 15 - 41 U/L - - 35  ALT 0 - 44 U/L - - 19    ECHOCARDIOGRAM COMPLETE  Result Date: 09/17/2019   ECHOCARDIOGRAM REPORT   Patient Name:   Stacey Ramos Date of Exam: 09/17/2019 Medical Rec #:  [id number]        Height:       64.0 in Accession #:    [id number]       Weight:  177.6 lb Date of Birth:  05-07-1981        BSA:          1.86 m Patient Age:    38 years         BP:           105/67 mmHg Patient Gender: F                HR:           47 bpm. Exam Location:  Inpatient Procedure: 2D Echo Indications:    Syncope R55  History:        Patient has prior history of Echocardiogram examinations. Risk                 Factors:Former Smoker.  Sonographer:    Mikki Santee RDCS (AE) Referring Phys: [id number] [place]  1. Left ventricular ejection fraction, by visual estimation, is 60 to 65%. The left ventricle has normal function. There is no left ventricular hypertrophy.  2. The left ventricle has no regional wall motion abnormalities.  3. Global right ventricle has normal systolic function.The right ventricular size is normal. No increase in right ventricular wall thickness.  4. Left atrial size was normal.  5. Right atrial size was normal.  6. The mitral valve is normal in structure. No evidence of mitral valve  regurgitation. No evidence of mitral stenosis.  7. The tricuspid valve is normal in structure.  8. The aortic valve is normal in structure. Aortic valve regurgitation is not visualized. No evidence of aortic valve sclerosis or stenosis.  9. The pulmonic valve was normal in structure. Pulmonic valve regurgitation is not visualized. 10. Normal pulmonary artery systolic pressure. 11. The inferior vena cava is dilated in size with >50% respiratory variability, suggesting right atrial pressure of 8 mmHg. FINDINGS  Left Ventricle: Left ventricular ejection fraction, by visual estimation, is 60 to 65%. The left ventricle has normal function. The left ventricle has no regional wall motion abnormalities. There is no left ventricular hypertrophy. Left ventricular diastolic parameters were normal. Normal left atrial pressure. Right Ventricle: The right ventricular size is normal. No increase in right ventricular wall thickness. Global RV systolic function is has normal systolic function. The tricuspid regurgitant velocity is 2.00 m/s, and with an assumed right atrial pressure  of 8 mmHg, the estimated right ventricular systolic pressure is normal at 24.1 mmHg. Left Atrium: Left atrial size was normal in size. Right Atrium: Right atrial size was normal in size Pericardium: There is no evidence of pericardial effusion. Mitral Valve: The mitral valve is normal in structure. No evidence of mitral valve regurgitation. No evidence of mitral valve stenosis by observation. Tricuspid Valve: The tricuspid valve is normal in structure. Tricuspid valve regurgitation is mild. Aortic Valve: The aortic valve is normal in structure. Aortic valve regurgitation is not visualized. The aortic valve is structurally normal, with no evidence of sclerosis or stenosis. Pulmonic Valve: The pulmonic valve was normal in structure. Pulmonic valve regurgitation is not visualized. Pulmonic regurgitation is not visualized. Aorta: The aortic root, ascending  aorta and aortic arch are all structurally normal, with no evidence of dilitation or obstruction. Venous: The inferior vena cava is dilated in size with greater than 50% respiratory variability, suggesting right atrial pressure of 8 mmHg. IAS/Shunts: No atrial level shunt detected by color flow Doppler. There is no evidence of a patent foramen ovale. No ventricular septal defect is seen or detected. There is no evidence of an atrial  septal defect.  LEFT VENTRICLE PLAX 2D LVIDd:         5.10 cm  Diastology LVIDs:         3.30 cm  LV e' lateral:   10.30 cm/s LV PW:         0.80 cm  LV E/e' lateral: 7.8 LV IVS:        0.70 cm  LV e' medial:    10.60 cm/s LVOT diam:     2.00 cm  LV E/e' medial:  7.6 LV SV:         80 ml LV SV Index:   41.19 LVOT Area:     3.14 cm  RIGHT VENTRICLE RV S prime:     7.94 cm/s TAPSE (M-mode): 1.8 cm LEFT ATRIUM             Index       RIGHT ATRIUM          Index LA diam:        3.50 cm 1.88 cm/m  RA Area:     9.47 cm LA Vol (A2C):   44.5 ml 23.92 ml/m RA Volume:   17.70 ml 9.52 ml/m LA Vol (A4C):   20.5 ml 11.02 ml/m LA Biplane Vol: 32.0 ml 17.20 ml/m  AORTIC VALVE LVOT Vmax:   97.50 cm/s LVOT Vmean:  66.500 cm/s LVOT VTI:    0.219 m  AORTA Ao Root diam: 2.60 cm MITRAL VALVE                        TRICUSPID VALVE MV Area (PHT): 2.01 cm             TR Peak grad:   16.1 mmHg MV PHT:        109.33 msec          TR Vmax:        211.00 cm/s MV Decel Time: 377 msec MV E velocity: 80.60 cm/s 103 cm/s  SHUNTS MV A velocity: 60.00 cm/s 70.3 cm/s Systemic VTI:  0.22 m MV E/A ratio:  1.34       1.5       Systemic Diam: 2.00 cm  Dani Gobble Croitoru MD Electronically signed by Sanda Klein MD Signature Date/Time: 09/17/2019/12:11:21 PM    Final    VAS US CAROTID  Result Date: 09/17/2019 Carotid Arterial Duplex Study Indications: Syncope. Performing Technologist: Maudry Mayhew MHA, RDMS, RVT, RDCS  Examination Guidelines: A complete evaluation includes B-mode imaging, spectral Doppler,  color Doppler, and power Doppler as needed of all accessible portions of each vessel. Bilateral testing is considered an integral part of a complete examination. Limited examinations for reoccurring indications may be performed as noted.  Right Carotid Findings: +----------+--------+--------+--------+------------------+--------+           PSV cm/sEDV cm/sStenosisPlaque DescriptionComments +----------+--------+--------+--------+------------------+--------+ CCA Prox  79      14                                         +----------+--------+--------+--------+------------------+--------+ CCA Distal81      20                                         +----------+--------+--------+--------+------------------+--------+ ICA Prox  71      21                                         +----------+--------+--------+--------+------------------+--------+  ICA Distal82      28                                         +----------+--------+--------+--------+------------------+--------+ ECA       99      9                                          +----------+--------+--------+--------+------------------+--------+ +----------+--------+-------+----------------+-------------------+           PSV cm/sEDV cmsDescribe        Arm Pressure (mmHG) +----------+--------+-------+----------------+-------------------+ [id number]            Multiphasic, WNL                    +----------+--------+-------+----------------+-------------------+ +---------+--------+--+--------+--+---------+ VertebralPSV cm/s41EDV cm/s11Antegrade +---------+--------+--+--------+--+---------+  Left Carotid Findings: +----------+--------+--------+--------+------------------+--------+           PSV cm/sEDV cm/sStenosisPlaque DescriptionComments +----------+--------+--------+--------+------------------+--------+ CCA Prox  148     29                                          +----------+--------+--------+--------+------------------+--------+ CCA Distal131     30                                         +----------+--------+--------+--------+------------------+--------+ ICA Prox  104     27                                         +----------+--------+--------+--------+------------------+--------+ ICA Distal96      34                                         +----------+--------+--------+--------+------------------+--------+ ECA       169     23                                         +----------+--------+--------+--------+------------------+--------+ +----------+--------+--------+----------------+-------------------+           PSV cm/sEDV cm/sDescribe        Arm Pressure (mmHG) +----------+--------+--------+----------------+-------------------+ Subclavian130             Multiphasic, WNL                    +----------+--------+--------+----------------+-------------------+ +---------+--------+--+--------+--+---------+ VertebralPSV cm/s72EDV cm/s23Antegrade +---------+--------+--+--------+--+---------+  Summary: Right Carotid: Velocities in the right ICA are consistent with a 1-39% stenosis. Left Carotid: Velocities in the left ICA are consistent with a 1-39% stenosis. Vertebrals:  Bilateral vertebral arteries demonstrate antegrade flow. Subclavians: Normal flow hemodynamics were seen in bilateral subclavian              arteries. *See table(s) above for measurements and observations.  Electronically signed by Monica Martinez MD on 09/17/2019 at 2:45:04 PM.    Final     Assessment/Plan:  Active Problems:   Syncope  Syncope due to orthostatic hypotension   Patient Summary: Stacey Ramos is a 38 y.o. with pertinent PMH of  PCOS, endometriosis, iron deficiency anemia, asthma, psoriasis, history of thyroid nodule admit for syncope 2/2 orthostatic hypotension on hospital day 1  #Syncope: #Syncope induced convulsion:  #Orthostasis  vs Autonomic Dysfunction: - Continues to have low blood pressures with bradycardia.  - Repeat orthostatics showed diastolic decrease of 10 mmHg - We will start midodrine now for orthostatic hypotension -Continue PT ambulation with telemetry -Morning cortisol was normal - Continue maintenance fluids (125 cc/hr) -Appreciate cardiology's assistance.   #Sinus bradycardia -Continue telemetry   Diet: Carb modified IVF: Continue fluids VTE: Enoxaparin Code: Full  Dispo: Anticipated discharge today.   Marianna Payment, D.O. [facility], PGY-1 Date 09/18/2019 Time 5:21 AM Pager: [phone]

## 2019-09-18 NOTE — Progress Notes (Signed)
Subjective:  Feels well however she has not gotten up from the bed.  No events at night.  No significant arrhythmias on telemetry.  Intake/Output from previous day:  I/O last 3 completed shifts: In: 4616.4 [P.O.:2360; I.V.:1060; IV Piggyback:1196.4] Out: 3100 [Urine:3100] No intake/output data recorded.  Blood pressure 127/84, pulse (!) 51, temperature 98.6 F (37 C), temperature source Oral, resp. rate 20, height 5' 4" (1.626 m), weight 82.3 kg, SpO2 100 %. Body mass index is 31.14 kg/m.  Physical Exam  Constitutional:  She is moderately built and mildly obese in no acute distress  HENT:  Head: Atraumatic.  Eyes: Conjunctivae are normal.  Neck: No JVD present. No thyromegaly present.  Cardiovascular: Normal rate, regular rhythm, normal heart sounds and intact distal pulses. Exam reveals no gallop.  No murmur heard. No leg edema, no JVD.  Pulmonary/Chest: Effort normal and breath sounds normal.  Abdominal: Soft. Bowel sounds are normal.  Musculoskeletal:        General: Normal range of motion.     Cervical back: Neck supple.  Neurological: She is alert.  Skin: Skin is warm and dry.  Psychiatric: She has a normal mood and affect.   Lab Results: BMP BNP (last 3 results) No results for input(s): BNP in the last 8760 hours.  ProBNP (last 3 results) No results for input(s): PROBNP in the last 8760 hours. BMP Latest Ref Rng & Units 09/18/2019 09/17/2019 09/16/2019  Glucose 70 - 99 mg/dL 86 95 95  BUN 6 - 20 mg/dL _0 Creatinine 0.44 - 1.00 mg/dL 0.59 0.49 0.60  Sodium 135 - 145 mmol/L 139 141 138  Potassium 3.5 - 5.1 mmol/L 4.3 3.7 3.6  Chloride 98 - 111 mmol/L 108 110 105  CO2 22 - 32 mmol/L 25 24 -  Calcium 8.9 - 10.3 mg/dL 8.4(L) 8.5(L) -   Hepatic Function Latest Ref Rng & Units 09/18/2019 09/16/2019  Total Protein 6.5 - 8.1 g/dL 5.4(L) 6.6  Albumin 3.5 - 5.0 g/dL 3.0(L) 3.8  AST 15 - 41 U/L 25 35  ALT 0 - 44 U/L 17 19  Alk Phosphatase 38 - 126 U/L 39 45   Total Bilirubin 0.3 - 1.2 mg/dL 0.3 0.4   CBC Latest Ref Rng & Units 09/18/2019 09/17/2019 09/16/2019  WBC 4.0 - 10.5 K/uL 6.8 7.4 8.4  Hemoglobin 12.0 - 15.0 g/dL 12.8 13.3 13.7  Hematocrit 36.0 - 46.0 % 39.9 40.2 41.1  Platelets 150 - 400 K/uL 237 236 221   Lipid Panel  No results found for: CHOL, TRIG, HDL, CHOLHDL, VLDL, LDLCALC, LDLDIRECT Cardiac Panel (last 3 results) No results for input(s): CKTOTAL, CKMB, TROPONINI, RELINDX in the last 72 hours.  HEMOGLOBIN A1C Lab Results  Component Value Date   HGBA1C 4.9 09/16/2019   MPG 93.93 09/16/2019   TSH Recent Labs    09/16/19 1700  TSH 1.457   Imaging: CT Head Wo Contrast  Result Date: 09/16/2019 CLINICAL DATA:  Syncopal episodes x2. Fall with facial trauma, headache and neck pain. EXAM: CT HEAD WITHOUT CONTRAST CT CERVICAL SPINE WITHOUT CONTRAST TECHNIQUE: Multidetector CT imaging of the head and cervical spine was performed following the standard protocol without intravenous contrast. Multiplanar CT image reconstructions of the cervical spine were also generated. COMPARISON:  None. FINDINGS: CT HEAD FINDINGS Despite efforts by the technologist and patient, mild motion artifact is present on today's exam and could not be eliminated. This reduces exam sensitivity and specificity. Some images were repeated. Brain: There is no evidence  of acute intracranial hemorrhage, mass lesion, brain edema or extra-axial fluid collection. The ventricles and subarachnoid spaces are appropriately sized for age. There is no CT evidence of acute cortical infarction. Vascular:  No hyperdense vessel identified. Skull: Negative for fracture or focal lesion. Sinuses/Orbits: Bilateral maxillary and ethmoid sinus mucosal thickening with possible layering fluid in the left maxillary sinus. No visible facial fractures. The additional visualized paranasal sinuses, mastoid air cells and middle ears are clear. No orbital abnormalities. Other: None. CT CERVICAL  SPINE FINDINGS Alignment: Normal. Skull base and vertebrae: No evidence of acute fracture or traumatic subluxation. Soft tissues and spinal canal: No prevertebral fluid or swelling. No visible canal hematoma. Disc levels: The disc heights are maintained. No large disc herniation or significant foraminal compromise. Upper chest: No significant findings. Minimal heterogeneity of the thyroid gland without suspicious nodularity. Other: None. IMPRESSION: 1. No acute intracranial or calvarial findings. 2. No evidence of acute cervical spine fracture, traumatic subluxation or static signs of instability. 3. Bilateral maxillary and ethmoid sinus mucosal thickening with possible layering fluid in the left maxillary sinus. Electronically Signed   By: Richardean Sale M.D.   On: 09/16/2019 18:11   CT Cervical Spine Wo Contrast  Result Date: 09/16/2019 CLINICAL DATA:  Syncopal episodes x2. Fall with facial trauma, headache and neck pain. EXAM: CT HEAD WITHOUT CONTRAST CT CERVICAL SPINE WITHOUT CONTRAST TECHNIQUE: Multidetector CT imaging of the head and cervical spine was performed following the standard protocol without intravenous contrast. Multiplanar CT image reconstructions of the cervical spine were also generated. COMPARISON:  None. FINDINGS: CT HEAD FINDINGS Despite efforts by the technologist and patient, mild motion artifact is present on today's exam and could not be eliminated. This reduces exam sensitivity and specificity. Some images were repeated. Brain: There is no evidence of acute intracranial hemorrhage, mass lesion, brain edema or extra-axial fluid collection. The ventricles and subarachnoid spaces are appropriately sized for age. There is no CT evidence of acute cortical infarction. Vascular:  No hyperdense vessel identified. Skull: Negative for fracture or focal lesion. Sinuses/Orbits: Bilateral maxillary and ethmoid sinus mucosal thickening with possible layering fluid in the left maxillary sinus. No  visible facial fractures. The additional visualized paranasal sinuses, mastoid air cells and middle ears are clear. No orbital abnormalities. Other: None. CT CERVICAL SPINE FINDINGS Alignment: Normal. Skull base and vertebrae: No evidence of acute fracture or traumatic subluxation. Soft tissues and spinal canal: No prevertebral fluid or swelling. No visible canal hematoma. Disc levels: The disc heights are maintained. No large disc herniation or significant foraminal compromise. Upper chest: No significant findings. Minimal heterogeneity of the thyroid gland without suspicious nodularity. Other: None. IMPRESSION: 1. No acute intracranial or calvarial findings. 2. No evidence of acute cervical spine fracture, traumatic subluxation or static signs of instability. 3. Bilateral maxillary and ethmoid sinus mucosal thickening with possible layering fluid in the left maxillary sinus. Electronically Signed   By: Richardean Sale M.D.   On: 09/16/2019 18:11   DG Chest Port 1 View  Result Date: 09/16/2019 CLINICAL DATA:  Shortness of breath EXAM: PORTABLE CHEST 1 VIEW COMPARISON:  Portable exam 1805 hours without priors for comparison. FINDINGS: Normal heart size, mediastinal contours, and pulmonary vascularity. Lungs clear. No pulmonary infiltrate, pleural effusion, or pneumothorax. Osseous structures unremarkable. IMPRESSION: No acute abnormalities. Electronically Signed   By: Lavonia Dana M.D.   On: 09/16/2019 18:15   ECHOCARDIOGRAM COMPLETE  Result Date: 09/17/2019   ECHOCARDIOGRAM REPORT   Patient Name:   CURLEY FAYETTE  Date of Exam: 09/17/2019 Medical Rec #:  [id number]        Height:       64.0 in Accession #:    [id number]       Weight:       177.6 lb Date of Birth:  10-08-1980        BSA:          1.86 m Patient Age:    89 years         BP:           105/67 mmHg Patient Gender: F                HR:           47 bpm. Exam Location:  Inpatient Procedure: 2D Echo Indications:    Syncope R55  History:         Patient has prior history of Echocardiogram examinations. Risk                 Factors:Former Smoker.  Sonographer:    Mikki Santee RDCS (AE) Referring Phys: [id number] [place]  1. Left ventricular ejection fraction, by visual estimation, is 60 to 65%. The left ventricle has normal function. There is no left ventricular hypertrophy.  2. The left ventricle has no regional wall motion abnormalities.  3. Global right ventricle has normal systolic function.The right ventricular size is normal. No increase in right ventricular wall thickness.  4. Left atrial size was normal.  5. Right atrial size was normal.  6. The mitral valve is normal in structure. No evidence of mitral valve regurgitation. No evidence of mitral stenosis.  7. The tricuspid valve is normal in structure.  8. The aortic valve is normal in structure. Aortic valve regurgitation is not visualized. No evidence of aortic valve sclerosis or stenosis.  9. The pulmonic valve was normal in structure. Pulmonic valve regurgitation is not visualized. 10. Normal pulmonary artery systolic pressure. 11. The inferior vena cava is dilated in size with >50% respiratory variability, suggesting right atrial pressure of 8 mmHg. FINDINGS  Left Ventricle: Left ventricular ejection fraction, by visual estimation, is 60 to 65%. The left ventricle has normal function. The left ventricle has no regional wall motion abnormalities. There is no left ventricular hypertrophy. Left ventricular diastolic parameters were normal. Normal left atrial pressure. Right Ventricle: The right ventricular size is normal. No increase in right ventricular wall thickness. Global RV systolic function is has normal systolic function. The tricuspid regurgitant velocity is 2.00 m/s, and with an assumed right atrial pressure  of 8 mmHg, the estimated right ventricular systolic pressure is normal at 24.1 mmHg. Left Atrium: Left atrial size was normal in size. Right Atrium: Right  atrial size was normal in size Pericardium: There is no evidence of pericardial effusion. Mitral Valve: The mitral valve is normal in structure. No evidence of mitral valve regurgitation. No evidence of mitral valve stenosis by observation. Tricuspid Valve: The tricuspid valve is normal in structure. Tricuspid valve regurgitation is mild. Aortic Valve: The aortic valve is normal in structure. Aortic valve regurgitation is not visualized. The aortic valve is structurally normal, with no evidence of sclerosis or stenosis. Pulmonic Valve: The pulmonic valve was normal in structure. Pulmonic valve regurgitation is not visualized. Pulmonic regurgitation is not visualized. Aorta: The aortic root, ascending aorta and aortic arch are all structurally normal, with no evidence of dilitation or obstruction. Venous: The inferior vena cava is dilated in size with greater than  50% respiratory variability, suggesting right atrial pressure of 8 mmHg. IAS/Shunts: No atrial level shunt detected by color flow Doppler. There is no evidence of a patent foramen ovale. No ventricular septal defect is seen or detected. There is no evidence of an atrial septal defect.  LEFT VENTRICLE PLAX 2D LVIDd:         5.10 cm  Diastology LVIDs:         3.30 cm  LV e' lateral:   10.30 cm/s LV PW:         0.80 cm  LV E/e' lateral: 7.8 LV IVS:        0.70 cm  LV e' medial:    10.60 cm/s LVOT diam:     2.00 cm  LV E/e' medial:  7.6 LV SV:         80 ml LV SV Index:   41.19 LVOT Area:     3.14 cm  RIGHT VENTRICLE RV S prime:     7.94 cm/s TAPSE (M-mode): 1.8 cm LEFT ATRIUM             Index       RIGHT ATRIUM          Index LA diam:        3.50 cm 1.88 cm/m  RA Area:     9.47 cm LA Vol (A2C):   44.5 ml 23.92 ml/m RA Volume:   17.70 ml 9.52 ml/m LA Vol (A4C):   20.5 ml 11.02 ml/m LA Biplane Vol: 32.0 ml 17.20 ml/m  AORTIC VALVE LVOT Vmax:   97.50 cm/s LVOT Vmean:  66.500 cm/s LVOT VTI:    0.219 m  AORTA Ao Root diam: 2.60 cm MITRAL VALVE                         TRICUSPID VALVE MV Area (PHT): 2.01 cm             TR Peak grad:   16.1 mmHg MV PHT:        109.33 msec          TR Vmax:        211.00 cm/s MV Decel Time: 377 msec MV E velocity: 80.60 cm/s 103 cm/s  SHUNTS MV A velocity: 60.00 cm/s 70.3 cm/s Systemic VTI:  0.22 m MV E/A ratio:  1.34       1.5       Systemic Diam: 2.00 cm  Dani Gobble Croitoru MD Electronically signed by Sanda Klein MD Signature Date/Time: 09/17/2019/12:11:21 PM    Final    VAS US CAROTID  Result Date: 09/17/2019 Carotid Arterial Duplex Study Indications: Syncope. Performing Technologist: Maudry Mayhew MHA, RDMS, RVT, RDCS  Examination Guidelines: A complete evaluation includes B-mode imaging, spectral Doppler, color Doppler, and power Doppler as needed of all accessible portions of each vessel. Bilateral testing is considered an integral part of a complete examination. Limited examinations for reoccurring indications may be performed as noted.  Right Carotid Findings: +----------+--------+--------+--------+------------------+--------+           PSV cm/sEDV cm/sStenosisPlaque DescriptionComments +----------+--------+--------+--------+------------------+--------+ CCA Prox  79      14                                         +----------+--------+--------+--------+------------------+--------+ CCA Distal81      20                                         +----------+--------+--------+--------+------------------+--------+  ICA Prox  71      21                                         +----------+--------+--------+--------+------------------+--------+ ICA Distal82      28                                         +----------+--------+--------+--------+------------------+--------+ ECA       99      9                                          +----------+--------+--------+--------+------------------+--------+ +----------+--------+-------+----------------+-------------------+           PSV cm/sEDV  cmsDescribe        Arm Pressure (mmHG) +----------+--------+-------+----------------+-------------------+ [id number]            Multiphasic, WNL                    +----------+--------+-------+----------------+-------------------+ +---------+--------+--+--------+--+---------+ VertebralPSV cm/s41EDV cm/s11Antegrade +---------+--------+--+--------+--+---------+  Left Carotid Findings: +----------+--------+--------+--------+------------------+--------+           PSV cm/sEDV cm/sStenosisPlaque DescriptionComments +----------+--------+--------+--------+------------------+--------+ CCA Prox  148     29                                         +----------+--------+--------+--------+------------------+--------+ CCA Distal131     30                                         +----------+--------+--------+--------+------------------+--------+ ICA Prox  104     27                                         +----------+--------+--------+--------+------------------+--------+ ICA Distal96      34                                         +----------+--------+--------+--------+------------------+--------+ ECA       169     23                                         +----------+--------+--------+--------+------------------+--------+ +----------+--------+--------+----------------+-------------------+           PSV cm/sEDV cm/sDescribe        Arm Pressure (mmHG) +----------+--------+--------+----------------+-------------------+ Subclavian130             Multiphasic, WNL                    +----------+--------+--------+----------------+-------------------+ +---------+--------+--+--------+--+---------+ VertebralPSV cm/s72EDV cm/s23Antegrade +---------+--------+--+--------+--+---------+  Summary: Right Carotid: Velocities in the right ICA are consistent with a 1-39% stenosis. Left Carotid: Velocities in the left ICA are consistent with a 1-39% stenosis. Vertebrals:   Bilateral vertebral arteries demonstrate antegrade flow. Subclavians: Normal flow hemodynamics were seen  in bilateral subclavian              arteries. *See table(s) above for measurements and observations.  Electronically signed by Monica Martinez MD on 09/17/2019 at 2:45:04 PM.    Final    Cardiac Studies:  Echocardiogram 09/17/2019:   Normal LVEF, essentially normal echocardiogram.  Scheduled Meds: . enoxaparin (LOVENOX) injection  40 mg Subcutaneous Q24H  . midodrine  10 mg Oral TID WC  . polyethylene glycol  17 g Oral Daily   Continuous Infusions: . sodium chloride     PRN Meds:.acetaminophen **OR** acetaminophen  Assessment/Plan:  1. Syncope suggest orthostatic syncope, complaint of vasovagal episode.  No definite orthostatic changes while in the hospital however patient also received aggressive fluid resuscitation. 2.  Cough, recent upper respiratory infection, COVID-19 negative. 3.  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  EKG 09/16/2019: Marked sinus bradycardia at rate of 46 bpm low-voltage complexes, otherwise normal EKG.  Telemetry 09/18/19 : Normal sinus rhythm/sinus bradycardia.  No heart block.  Abnormal serum cortisol yesterday, morning cortisol is normal, agree with obtaining the ACTH, she will need close follow-up and repeat cosyntropin test if necessary to exclude primary hypoadrenal exam.  No definite etiology for orthostasis, normal LVEF, low risk for CAD.  Do not suspect arrhythmias as the etiology for her symptoms as they are truly orthostatic symptoms.  I will be happy to see her back in the office, will consider an event monitor to complete the cardiac work-up.  We could empirically start her on midodrine for now.  Thank you for the consultation.  Ambulate, check HR response and orthostatics prior to Midodrine administration.  Bradycardia will not present with syncope but will as decreased exercise tolerance or dyspnea mostly.    Adrian Prows, M.D. 09/18/2019, 11:38 AM [facility] Pager: [phone] Office: [phone] If no answer: [phone]

## 2019-09-18 NOTE — Plan of Care (Signed)

## 2019-09-19 LAB — ACTH: C206 ACTH: 76.1 pg/mL — ABNORMAL HIGH (ref 7.2–63.3)

## 2019-09-20 ENCOUNTER — Telehealth: Payer: Self-pay

## 2019-09-20 NOTE — Telephone Encounter (Signed)
Pt cx her appointment for the monitor and she will call back if she gets approved for medicaid; She cannot afford the monitor out of pocket

## 2019-09-21 LAB — CULTURE, BLOOD (ROUTINE X 2)
Culture: NO GROWTH
Culture: NO GROWTH

## 2020-02-13 ENCOUNTER — Emergency Department (HOSPITAL_COMMUNITY): Payer: Medicaid Other

## 2020-02-13 ENCOUNTER — Emergency Department (HOSPITAL_COMMUNITY)
Admission: EM | Admit: 2020-02-13 | Discharge: 2020-02-13 | Disposition: A | Discharge status: Home / Self Care | Payer: Medicaid Other | Attending: Emergency Medicine | Admitting: Emergency Medicine

## 2020-02-13 ENCOUNTER — Encounter (HOSPITAL_COMMUNITY): Payer: Self-pay

## 2020-02-13 ENCOUNTER — Other Ambulatory Visit: Payer: Self-pay

## 2020-02-13 DIAGNOSIS — N939 Abnormal uterine and vaginal bleeding, unspecified: Secondary | ICD-10-CM

## 2020-02-13 DIAGNOSIS — Z79899 Other long term (current) drug therapy: Secondary | ICD-10-CM | POA: Insufficient documentation

## 2020-02-13 DIAGNOSIS — O200 Threatened abortion: Secondary | ICD-10-CM | POA: Insufficient documentation

## 2020-02-13 DIAGNOSIS — O209 Hemorrhage in early pregnancy, unspecified: Secondary | ICD-10-CM | POA: Diagnosis present

## 2020-02-13 DIAGNOSIS — Z3A00 Weeks of gestation of pregnancy not specified: Secondary | ICD-10-CM | POA: Insufficient documentation

## 2020-02-13 DIAGNOSIS — Z87891 Personal history of nicotine dependence: Secondary | ICD-10-CM | POA: Insufficient documentation

## 2020-02-13 DIAGNOSIS — O99519 Diseases of the respiratory system complicating pregnancy, unspecified trimester: Secondary | ICD-10-CM | POA: Insufficient documentation

## 2020-02-13 DIAGNOSIS — J45909 Unspecified asthma, uncomplicated: Secondary | ICD-10-CM | POA: Diagnosis not present

## 2020-02-13 DIAGNOSIS — O3680X0 Pregnancy with inconclusive fetal viability, not applicable or unspecified: Secondary | ICD-10-CM | POA: Diagnosis not present

## 2020-02-13 LAB — CBC WITH DIFFERENTIAL/PLATELET
Abs Immature Granulocytes: 0.03 10*3/uL (ref 0.00–0.07)
Basophils Absolute: 0.1 10*3/uL (ref 0.0–0.1)
Basophils Relative: 1 %
Eosinophils Absolute: 0.1 10*3/uL (ref 0.0–0.5)
Eosinophils Relative: 1 %
HCT: 41.8 % (ref 36.0–46.0)
Hemoglobin: 13.7 g/dL (ref 12.0–15.0)
Immature Granulocytes: 0 %
Lymphocytes Relative: 38 %
Lymphs Abs: 2.7 10*3/uL (ref 0.7–4.0)
MCH: 31.5 pg (ref 26.0–34.0)
MCHC: 32.8 g/dL (ref 30.0–36.0)
MCV: 96.1 fL (ref 80.0–100.0)
Monocytes Absolute: 0.8 10*3/uL (ref 0.1–1.0)
Monocytes Relative: 11 %
Neutro Abs: 3.5 10*3/uL (ref 1.7–7.7)
Neutrophils Relative %: 49 %
Platelets: 219 10*3/uL (ref 150–400)
RBC: 4.35 MIL/uL (ref 3.87–5.11)
RDW: 12.5 % (ref 11.5–15.5)
WBC: 7.2 10*3/uL (ref 4.0–10.5)
nRBC: 0 % (ref 0.0–0.2)

## 2020-02-13 LAB — URINALYSIS, ROUTINE W REFLEX MICROSCOPIC
Bilirubin Urine: NEGATIVE
Glucose, UA: NEGATIVE mg/dL
Ketones, ur: NEGATIVE mg/dL
Nitrite: NEGATIVE
Protein, ur: NEGATIVE mg/dL
Specific Gravity, Urine: 1.023 (ref 1.005–1.030)
pH: 6 (ref 5.0–8.0)

## 2020-02-13 LAB — WET PREP, GENITAL
Sperm: NONE SEEN
Trich, Wet Prep: NONE SEEN
Yeast Wet Prep HPF POC: NONE SEEN

## 2020-02-13 LAB — COMPREHENSIVE METABOLIC PANEL
ALT: 14 U/L (ref 0–44)
AST: 22 U/L (ref 15–41)
Albumin: 4.2 g/dL (ref 3.5–5.0)
Alkaline Phosphatase: 46 U/L (ref 38–126)
Anion gap: 8 (ref 5–15)
BUN: 16 mg/dL (ref 6–20)
CO2: 26 mmol/L (ref 22–32)
Calcium: 8.7 mg/dL — ABNORMAL LOW (ref 8.9–10.3)
Chloride: 106 mmol/L (ref 98–111)
Creatinine, Ser: 0.51 mg/dL (ref 0.44–1.00)
GFR calc Af Amer: >60 mL/min (ref >60)
GFR calc non Af Amer: >60 mL/min (ref >60)
Glucose, Bld: 74 mg/dL (ref 70–99)
Potassium: 3.6 mmol/L (ref 3.5–5.1)
Sodium: 140 mmol/L (ref 135–145)
Total Bilirubin: 0.6 mg/dL (ref 0.3–1.2)
Total Protein: 7.4 g/dL (ref 6.5–8.1)

## 2020-02-13 LAB — I-STAT BETA HCG BLOOD, ED (MC, WL, AP ONLY): I-stat hCG, quantitative: >2000 m[IU]/mL — ABNORMAL HIGH (ref <5)

## 2020-02-13 LAB — HCG, QUANTITATIVE, PREGNANCY: hCG, Beta Chain, Quant, S: 6832 m[IU]/mL — ABNORMAL HIGH (ref <5)

## 2020-02-13 MED ORDER — SODIUM CHLORIDE 0.9 % IV BOLUS
1000.0000 mL | Freq: Once | INTRAVENOUS | Status: AC
  Administered 2020-02-13: 1000 mL via INTRAVENOUS

## 2020-02-13 NOTE — Discharge Instructions (Addendum)
Please follow-up at the [facility] in 2 days to repeat your quantitative hCG blood test  If you experience any new or worsening symptoms please do not hesitate to return to the emergency department for reevaluation.  It was a pleasure to meet you.

## 2020-02-13 NOTE — ED Triage Notes (Signed)
Patient states she has had heavy bleeding and excessive abdominal cramping since 01/22/20.

## 2020-02-13 NOTE — ED Provider Notes (Signed)
[facility] DEPT Provider Note   CSN: [id number] Arrival date & time: 02/13/20  1329     History Chief Complaint  Patient presents with  . Vaginal Bleeding    Stacey Ramos is a 39 y.o. female.  HPI HPI Comments: Stacey Ramos is a 39 y.o. female who presents to the Emergency Department complaining of vaginal bleeding. 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  For the past 2 years since these procedures were performed she experienced regular monthly menstrual cycles.  She typically has severe pelvic cramping during her menstrual cycles.  On 01/22/2020 she states her current cycle started "1 week early".  Since this started she has been experiencing waxing and waning, 10/10, pelvic cramping.  Due to her history of gastric bypass she does not take NSAIDs.  She denies she has taken anything for pain.  She states her current pain feels similar to her pelvic cramping prior to ablation.  She additionally has been experiencing waxing and waning vaginal bleeding.  She is soaking about "3-4 pads per day" since the onset of her symptoms.  She notes some associated lightheadedness.  She states that she has had decreased p.o. intake since the onset of her symptoms to try to avoid worsening "bloating".  She recently moved to the area in June of last year.  She does not have a primary care provider or an OB/GYN yet.  She is sexually active with 1 female partner in a monogamous relationship.  They do not use protection.  She is on Medicaid.  She denies fevers, chills, chest pain, shortness of breath, nausea, vomiting, diarrhea, constipation, dysuria, hematuria, vaginal discharge, syncope.     Past Medical History:  Diagnosis Date  . Asthma   . Endometriosis   . Iron deficiency anemia   . Polycystic ovarian syndrome     Patient Active Problem List   Diagnosis Date Noted  . Syncope due to  orthostatic hypotension 09/17/2019  . Syncope 09/16/2019    Past Surgical History:  Procedure Laterality Date  . BARIATRIC SURGERY  2017  . ENDOMETRIAL ABLATION  2019     OB History   No obstetric history on file.     Family History  Problem Relation Age of Onset  . Diabetes Mother   . Multiple myeloma Father   . Colon cancer Father   . Diabetes Father   . Stroke Father   . Aneurysm Father   . Diabetes Maternal Grandmother   . Stroke Paternal Grandmother   . Breast cancer Paternal Grandmother   . Colon cancer Paternal Grandfather     Social History   Tobacco Use  . Smoking status: Former Smoker    Types: Cigarettes  . Smokeless tobacco: Never Used  Substance Use Topics  . Alcohol use: Not Currently  . Drug use: Not Currently    Home Medications Prior to Admission medications   Medication Sig Start Date End Date Taking? Authorizing Provider  acetaminophen (TYLENOL) 325 MG tablet Take 650 mg by mouth every 6 (six) hours as needed for moderate pain.    [provider]  BIOTIN PO Take 1 tablet by mouth every other day.    [provider]  Calcium Carbonate Antacid (ANTACID PO) Take 2 tablets by mouth daily as needed.    [provider]  CALCIUM CITRATE PO Take 1 tablet by mouth 3 (three) times daily.  [provider]  Cyanocobalamin (VITAMIN B12 PO) Take 1 tablet by mouth daily.    [provider]  Multiple Vitamins-Minerals (AIRBORNE GUMMIES) CHEW Chew 4 tablets by mouth 3 (three) times daily.    [provider]  Pediatric Multivitamins-Iron (FLINTSTONES COMPLETE PO) Take 2 tablets by mouth daily.    [provider]    Allergies    Patient has no known allergies.  Review of Systems   Review of Systems  All other systems reviewed and are negative. Ten systems reviewed and are negative for acute change, except as noted in the HPI.    Physical Exam Updated Vital Signs BP 106/66 (BP Location: Right  Arm)   Pulse 78   Temp 98.5 F (36.9 C) (Oral)   Resp 16   Ht '5\' 4"'  (1.626 m)   Wt 81.6 kg   LMP 02/13/2020   SpO2 99%   BMI 30.90 kg/m   Physical Exam Vitals and nursing note reviewed.  Constitutional:      General: She is in acute distress.     Appearance: Normal appearance. She is not ill-appearing, toxic-appearing or diaphoretic.     Comments: Patient is sitting upright speaking clearly and coherently.  She is tearful during the exam and appears to be in significant pain.  HENT:     Head: Normocephalic and atraumatic.     Right Ear: External ear normal.     Left Ear: External ear normal.     Nose: Nose normal.     Mouth/Throat:     Mouth: Mucous membranes are moist.     Pharynx: Oropharynx is clear. No oropharyngeal exudate or posterior oropharyngeal erythema.  Eyes:     General: No scleral icterus.       Right eye: No discharge.        Left eye: No discharge.     Extraocular Movements: Extraocular movements intact.     Conjunctiva/sclera: Conjunctivae normal.     Pupils: Pupils are equal, round, and reactive to light.  Cardiovascular:     Rate and Rhythm: Normal rate and regular rhythm.     Pulses: Normal pulses.     Heart sounds: Normal heart sounds. No murmur. No friction rub. No gallop.   Pulmonary:     Effort: Pulmonary effort is normal. No respiratory distress.     Breath sounds: Normal breath sounds. No stridor. No wheezing, rhonchi or rales.  Abdominal:     General: Abdomen is flat.     Palpations: Abdomen is soft.     Tenderness: There is abdominal tenderness.     Comments: Abdomen is soft.  Patient has exquisite tenderness throughout the lower abdomen that is worst in the suprapubic region.  No rebound.  No guarding.  Genitourinary:    Comments: Female nursing chaperone present.  Normal-appearing vulvar anatomy.  Normal-appearing vaginal mucosa.  Small amount of blood noted in the vaginal vault.  Unable to visualize cervical os.  Positive cervical motion  tenderness.  Positive right adnexal tenderness.  No visible vaginal discharge.  No erythema or edema. Musculoskeletal:        General: Tenderness present. Normal range of motion.     Cervical back: Normal range of motion and neck supple. No tenderness.     Right lower leg: No edema.     Left lower leg: No edema.     Comments: Mild diffuse lumbar tenderness.  Skin:    General: Skin is warm and dry.  Neurological:  General: No focal deficit present.     Mental Status: She is alert and oriented to person, place, and time.  Psychiatric:        Mood and Affect: Mood normal.        Behavior: Behavior normal.    ED Results / Procedures / Treatments   Labs (all labs ordered are listed, but only abnormal results are displayed) Labs Reviewed  WET PREP, GENITAL - Abnormal; Notable for the following components:      Result Value   Clue Cells Wet Prep HPF POC PRESENT (*)    WBC, Wet Prep HPF POC FEW (*)    All other components within normal limits  COMPREHENSIVE METABOLIC PANEL - Abnormal; Notable for the following components:   Calcium 8.7 (*)    All other components within normal limits  URINALYSIS, ROUTINE W REFLEX MICROSCOPIC - Abnormal; Notable for the following components:   Color, Urine AMBER (*)    APPearance HAZY (*)    Hgb urine dipstick LARGE (*)    Leukocytes,Ua TRACE (*)    Bacteria, UA FEW (*)    All other components within normal limits  HCG, QUANTITATIVE, PREGNANCY - Abnormal; Notable for the following components:   hCG, Beta Chain, Quant, S 6,832 (*)    All other components within normal limits  I-STAT BETA HCG BLOOD, ED ([facility], [facility], [facility] ONLY) - Abnormal; Notable for the following components:   I-stat hCG, quantitative >2,000.0 (*)    All other components within normal limits  CBC WITH DIFFERENTIAL/PLATELET  GC/CHLAMYDIA PROBE AMP ([place]) NOT AT [facility]    EKG None  Radiology US OB Comp Less 14 Wks  Result Date: 02/13/2020 CLINICAL DATA:  Vaginal bleeding,  beta HCG 6,832, history of polycystic ovarian syndrome and endometriosis EXAM: OBSTETRIC <14 WK [place] AND TRANSVAGINAL OB US TECHNIQUE: Both transabdominal and transvaginal ultrasound examinations were performed for complete evaluation of the gestation as well as the maternal uterus, adnexal regions, and pelvic cul-de-sac. Transvaginal technique was performed to assess early pregnancy. COMPARISON:  None. FINDINGS: Intrauterine gestational sac: None Yolk sac:  Not Visualized. Embryo:  Not Visualized. Maternal uterus/adnexae: Uterus is anteverted measuring 8.6 x 4.5 x 5.1 cm. Endometrium measures 9 mm in thickness. Complex hypoechoic structure is seen within the endocervical canal, measuring 0.9 x 0.9 x 0.9 cm. This is indeterminate. Multiple nabothian cysts are also noted. Right ovary measures 5.1 x 3.7 x 3.5 cm, with numerous small follicles identified. There is a 3.1 x 2.8 x 3.1 cm complex hyperechoic structure within the right ovary, displaying mild increased peripheral vascularity. This could reflect an endometrioma or hemorrhagic cyst. Ectopic pregnancy cannot be entirely excluded. The left ovary is unremarkable measuring 2.2 x 1.9 x 2.2 cm. There is trace simple appearing free fluid within the pelvis, which may be physiologic. IMPRESSION: 1. No evidence of intrauterine pregnancy at this time. 2. Complex subcentimeter hypoechoic structure within the cervix. This is indeterminate, though an abnormal gestational sac could be considered given the positive beta HCG and lack of documented live intrauterine pregnancy. 3. Complex hyperechoic structure within the right ovary, favor hemorrhagic cyst or endometrioma. Ectopic pregnancy cannot be entirely excluded given the lack of documented intrauterine pregnancy. Recommendation: Serial beta HCG measurements and follow-up ultrasound recommended to document live intrauterine pregnancy and exclude ectopic pregnancy. Electronically Signed   By: Randa Ngo M.D.   On:  02/13/2020 18:21   US OB Transvaginal  Result Date: 02/13/2020 CLINICAL DATA:  Vaginal bleeding, beta HCG 6,832,  history of polycystic ovarian syndrome and endometriosis EXAM: OBSTETRIC <14 WK [place] AND TRANSVAGINAL OB US TECHNIQUE: Both transabdominal and transvaginal ultrasound examinations were performed for complete evaluation of the gestation as well as the maternal uterus, adnexal regions, and pelvic cul-de-sac. Transvaginal technique was performed to assess early pregnancy. COMPARISON:  None. FINDINGS: Intrauterine gestational sac: None Yolk sac:  Not Visualized. Embryo:  Not Visualized. Maternal uterus/adnexae: Uterus is anteverted measuring 8.6 x 4.5 x 5.1 cm. Endometrium measures 9 mm in thickness. Complex hypoechoic structure is seen within the endocervical canal, measuring 0.9 x 0.9 x 0.9 cm. This is indeterminate. Multiple nabothian cysts are also noted. Right ovary measures 5.1 x 3.7 x 3.5 cm, with numerous small follicles identified. There is a 3.1 x 2.8 x 3.1 cm complex hyperechoic structure within the right ovary, displaying mild increased peripheral vascularity. This could reflect an endometrioma or hemorrhagic cyst. Ectopic pregnancy cannot be entirely excluded. The left ovary is unremarkable measuring 2.2 x 1.9 x 2.2 cm. There is trace simple appearing free fluid within the pelvis, which may be physiologic. IMPRESSION: 1. No evidence of intrauterine pregnancy at this time. 2. Complex subcentimeter hypoechoic structure within the cervix. This is indeterminate, though an abnormal gestational sac could be considered given the positive beta HCG and lack of documented live intrauterine pregnancy. 3. Complex hyperechoic structure within the right ovary, favor hemorrhagic cyst or endometrioma. Ectopic pregnancy cannot be entirely excluded given the lack of documented intrauterine pregnancy. Recommendation: Serial beta HCG measurements and follow-up ultrasound recommended to document live intrauterine  pregnancy and exclude ectopic pregnancy. Electronically Signed   By: Randa Ngo M.D.   On: 02/13/2020 18:21    Procedures Procedures   Medications Ordered in ED Medications  sodium chloride 0.9 % bolus 1,000 mL (1,000 mLs Intravenous New Bag/Given 02/13/20 1749)    ED Course  I have reviewed the triage vital signs and the nursing notes.  Pertinent labs & imaging results that were available during my care of the patient were reviewed by me and considered in my medical decision making (see chart for details).    MDM Rules/Calculators/A&P                      3:01 PM patient is a 39 year old female with a history of gastric bypass and endometriosis that presents with vaginal bleeding and exquisite pelvic cramping for the last 22 days.  Will obtain basic labs, pregnancy test, will perform pelvic exam.  7:00 PM UA shows large hemoglobin, trace leukocytes, few bacteria.  Pelvic exam significant for cervical motion tenderness, right adnexal tenderness, small amount of blood in the vaginal vault.  Patient has no complaints of dysuria.  Wet prep shows positive clue cells.  Patient is asymptomatic.  I discussed with patient and will not treat since she is asymptomatic.  Initial i-STAT hCG was elevated so hCG quantitative pregnancy test was obtained showing 6832.  CBC and CMP unremarkable.  Patient notes poor p.o. intake the last few weeks since the onset of her symptoms.  She was mildly hypotensive today.  I gave the patient a liter of fluids and her blood pressure is now 113/59.  Transvaginal ultrasound was obtained showing no evidence of intrauterine pregnancy.  There is a complex subcentimeter hypoechoic structure within the cervix.  Radiologist notes this is indeterminant, though an abnormal gestational sac could be a possibility.  They also note ectopic pregnancy cannot be completely ruled out.  There is also a complex hyperechoic structure  within the right ovary which they believe could be a  hemorrhagic cyst or endometrioma.  I discussed this patient with the on-call OB/GYN attending, Dr. Roselie Awkward.  He recommends that the patient follow-up at [facility] in 2 days for repeat quantitative hCG.  I additionally discussed this patient with my attending physician Dr. Madalyn Rob who also evaluated the patient. He discussed these findings with the patient and she understands to follow up with [facility] in 2 days.   Her questions were answered and she was amicable at the time of discharge. Her vital signs are stable.  Patient discharged to home/self care.  Condition at discharge: Stable  Note: Portions of this report may have been transcribed using voice recognition software. Every effort was made to ensure accuracy; however, inadvertent computerized transcription errors may be present.     Final Clinical Impression(s) / ED Diagnoses Final diagnoses:  Threatened miscarriage  Pregnancy of unknown anatomic location   Rx / DC Orders ED Discharge Orders    None       Rayna Sexton, PA-C 02/13/20 1918    Lucrezia Starch, MD 02/15/20 1004

## 2020-02-14 LAB — GC/CHLAMYDIA PROBE AMP (~~LOC~~) NOT AT ARMC
Chlamydia: NEGATIVE
Comment: NEGATIVE
Comment: NORMAL
Neisseria Gonorrhea: NEGATIVE

## 2020-02-15 ENCOUNTER — Encounter (HOSPITAL_COMMUNITY): Payer: Self-pay | Admitting: *Deleted

## 2020-02-15 ENCOUNTER — Inpatient Hospital Stay (HOSPITAL_COMMUNITY)
Admission: AD | Admit: 2020-02-15 | Discharge: 2020-02-15 | Disposition: A | Discharge status: Home / Self Care | Payer: Medicaid Other | Attending: Obstetrics & Gynecology | Admitting: Obstetrics & Gynecology

## 2020-02-15 DIAGNOSIS — O26891 Other specified pregnancy related conditions, first trimester: Secondary | ICD-10-CM | POA: Diagnosis not present

## 2020-02-15 DIAGNOSIS — O209 Hemorrhage in early pregnancy, unspecified: Secondary | ICD-10-CM

## 2020-02-15 DIAGNOSIS — E282 Polycystic ovarian syndrome: Secondary | ICD-10-CM | POA: Insufficient documentation

## 2020-02-15 DIAGNOSIS — N809 Endometriosis, unspecified: Secondary | ICD-10-CM | POA: Insufficient documentation

## 2020-02-15 DIAGNOSIS — Z3A00 Weeks of gestation of pregnancy not specified: Secondary | ICD-10-CM | POA: Diagnosis not present

## 2020-02-15 DIAGNOSIS — O3680X0 Pregnancy with inconclusive fetal viability, not applicable or unspecified: Secondary | ICD-10-CM | POA: Insufficient documentation

## 2020-02-15 DIAGNOSIS — R109 Unspecified abdominal pain: Secondary | ICD-10-CM | POA: Diagnosis not present

## 2020-02-15 DIAGNOSIS — O4690 Antepartum hemorrhage, unspecified, unspecified trimester: Secondary | ICD-10-CM

## 2020-02-15 LAB — CBC
HCT: 39.9 % (ref 36.0–46.0)
Hemoglobin: 13.3 g/dL (ref 12.0–15.0)
MCH: 31.1 pg (ref 26.0–34.0)
MCHC: 33.3 g/dL (ref 30.0–36.0)
MCV: 93.4 fL (ref 80.0–100.0)
Platelets: 223 10*3/uL (ref 150–400)
RBC: 4.27 MIL/uL (ref 3.87–5.11)
RDW: 12.6 % (ref 11.5–15.5)
WBC: 7.9 10*3/uL (ref 4.0–10.5)
nRBC: 0 % (ref 0.0–0.2)

## 2020-02-15 LAB — COMPREHENSIVE METABOLIC PANEL
ALT: 13 U/L (ref 0–44)
AST: 26 U/L (ref 15–41)
Albumin: 3.9 g/dL (ref 3.5–5.0)
Alkaline Phosphatase: 44 U/L (ref 38–126)
Anion gap: 8 (ref 5–15)
BUN: 17 mg/dL (ref 6–20)
CO2: 24 mmol/L (ref 22–32)
Calcium: 9.1 mg/dL (ref 8.9–10.3)
Chloride: 105 mmol/L (ref 98–111)
Creatinine, Ser: 0.66 mg/dL (ref 0.44–1.00)
GFR calc Af Amer: >60 mL/min (ref >60)
GFR calc non Af Amer: >60 mL/min (ref >60)
Glucose, Bld: 91 mg/dL (ref 70–99)
Potassium: 3.7 mmol/L (ref 3.5–5.1)
Sodium: 137 mmol/L (ref 135–145)
Total Bilirubin: 0.6 mg/dL (ref 0.3–1.2)
Total Protein: 6.5 g/dL (ref 6.5–8.1)

## 2020-02-15 LAB — ABO/RH: ABO/RH(D): O POS

## 2020-02-15 LAB — HCG, QUANTITATIVE, PREGNANCY: hCG, Beta Chain, Quant, S: 6448 m[IU]/mL — ABNORMAL HIGH (ref <5)

## 2020-02-15 LAB — TYPE AND SCREEN
ABO/RH(D): O POS
Antibody Screen: NEGATIVE

## 2020-02-15 NOTE — [facility] Note (Deleted)
Pt stated that she has to leave because she needed to get to the hotel that she was staying in to be able to pay the rent.  Pt reports that she was living in a tent and now has the hotel she does not want to mess that up.  AMA paperwork signed.  Pt states that she will come back once she has everything situated which would probably be tonight or tomorrow morning.  Manya Silvas, CNM notified.

## 2020-02-15 NOTE — [facility] Note (Signed)
Patient states "I was told to come in and get an ultrasound to see if I was in the process of a miscarriage or if it was tubular"  States she has been bleeding for 24 days.

## 2020-02-15 NOTE — MAU Provider Note (Signed)
History    First Provider Initiated Contact with Patient 02/15/20 1927      Chief Complaint:  Follow-up   Stacey Ramos is  39 y.o. G2P1000 Patient's last menstrual period was 12/24/2019 (exact date).. Patient is here for follow up of quantitative HCG and ongoing surveillance of pregnancy status.   She is uncertain weeks gestation. Has been bleeding for ~24 days. Found out that she was pregnant 2 days ago ago at [facility] ED. Had US showing no IUP. Showed: 1. No evidence of intrauterine pregnancy at this time.  2. Complex subcentimeter hypoechoic structure within the cervix. This is indeterminate, though an abnormal gestational sac could be considered given the positive beta HCG and lack of documented live intrauterine pregnancy.  3. Complex hyperechoic structure within the right ovary, favor hemorrhagic cyst or endometrioma. Ectopic pregnancy cannot be entirely excluded given the lack of documented intrauterine pregnancy.  Since her last visit, the patient is without new complaint. Pain has decreased siginificantly. Was mod-severe Sunday night. Has not taken anything for pain. Continues to have light, irreg bleeding.     ROS Abdominal Pain: Mild Vaginal bleeding: lighter than period.   Passage of clots or tissue: One clot. Denies tissue Dizziness: denies  Conflict (See Lab Report): O POS/O POS Performed at [facility], [place] [address], [place], [place] [address]   Her previous Quantitative HCG values are: Results for Stacey Ramos (MRN [id number]) as of 02/15/2020 19:51  Ref. Range 02/13/2020 14:56  HCG, Beta Chain, Quant, S Latest Ref Range: <5 mIU/mL 6,832 (H)    Physical Exam   Patient Vitals for the past 24 hrs:  BP Temp Temp src Pulse Resp SpO2 Height Weight  02/15/20 2104 (!) 103/50 97.8 F (36.6 C) Oral (!) 54 18 100 % -- --  02/15/20 1813 (!) 102/51 98.1 F (36.7 C) Oral 65 18 100 % 5\' 4"  (1.626 m) 180 lb (81.6 kg)  02/15/20 1804 -- 98.6 F (37 C) -- -- 16  -- -- --   Constitutional: Well-nourished female in no apparent distress. No pallor Neuro: Alert and oriented 4 Cardiovascular: Normal rate Respiratory: Normal effort and rate Abdomen: Soft, mild LLQ and left mid-abd TTP. No rebound tenderness or guarding.  Gynecological Exam: examination not indicated  Labs: Results for orders placed or performed during the hospital encounter of 02/15/20 (from the past 24 hour(s))  CBC   Collection Time: 02/15/20  6:21 PM  Result Value Ref Range   WBC 7.9 4.0 - 10.5 K/uL   RBC 4.27 3.87 - 5.11 MIL/uL   Hemoglobin 13.3 12.0 - 15.0 g/dL   HCT 39.9 36.0 - 46.0 %   MCV 93.4 80.0 - 100.0 fL   MCH 31.1 26.0 - 34.0 pg   MCHC 33.3 30.0 - 36.0 g/dL   RDW 12.6 11.5 - 15.5 %   Platelets 223 150 - 400 K/uL   nRBC 0.0 0.0 - 0.2 %  hCG, quantitative, pregnancy   Collection Time: 02/15/20  6:21 PM  Result Value Ref Range   hCG, Beta Chain, Quant, S 6,448 (H) <5 mIU/mL  Comprehensive metabolic panel   Collection Time: 02/15/20  6:21 PM  Result Value Ref Range   Sodium 137 135 - 145 mmol/L   Potassium 3.7 3.5 - 5.1 mmol/L   Chloride 105 98 - 111 mmol/L   CO2 24 22 - 32 mmol/L   Glucose, Bld 91 70 - 99 mg/dL   BUN 17 6 - 20 mg/dL   Creatinine,  Ser 0.66 0.44 - 1.00 mg/dL   Calcium 9.1 8.9 - 10.3 mg/dL   Total Protein 6.5 6.5 - 8.1 g/dL   Albumin 3.9 3.5 - 5.0 g/dL   AST 26 15 - 41 U/L   ALT 13 0 - 44 U/L   Alkaline Phosphatase 44 38 - 126 U/L   Total Bilirubin 0.6 0.3 - 1.2 mg/dL   GFR calc non Af Amer >60 >60 mL/min   GFR calc Af Amer >60 >60 mL/min   Anion gap 8 5 - 15  Type and screen   Collection Time: 02/15/20  6:21 PM  Result Value Ref Range   ABO/RH(D) O POS    Antibody Screen NEG    Sample Expiration      02/18/2020,2359 Performed at [facility], [place] [address] [place], [place] [address]   ABO/Rh   Collection Time: 02/15/20  6:21 PM  Result Value Ref Range   ABO/RH(D)      O POS Performed at [facility] [address] [place], [place] [address]     Ultrasound Studies:   US OB Comp Less 14 Wks  Result Date: 02/13/2020 CLINICAL DATA:  Vaginal bleeding, beta HCG 6,832, history of polycystic ovarian syndrome and endometriosis EXAM: OBSTETRIC <14 WK [place] AND TRANSVAGINAL OB US TECHNIQUE: Both transabdominal and transvaginal ultrasound examinations were performed for complete evaluation of the gestation as well as the maternal uterus, adnexal regions, and pelvic cul-de-sac. Transvaginal technique was performed to assess early pregnancy. COMPARISON:  None. FINDINGS: Intrauterine gestational sac: None Yolk sac:  Not Visualized. Embryo:  Not Visualized. Maternal uterus/adnexae: Uterus is anteverted measuring 8.6 x 4.5 x 5.1 cm. Endometrium measures 9 mm in thickness. Complex hypoechoic structure is seen within the endocervical canal, measuring 0.9 x 0.9 x 0.9 cm. This is indeterminate. Multiple nabothian cysts are also noted. Right ovary measures 5.1 x 3.7 x 3.5 cm, with numerous small follicles identified. There is a 3.1 x 2.8 x 3.1 cm complex hyperechoic structure within the right ovary, displaying mild increased peripheral vascularity. This could reflect an endometrioma or hemorrhagic cyst. Ectopic pregnancy cannot be entirely excluded. The left ovary is unremarkable measuring 2.2 x 1.9 x 2.2 cm. There is trace simple appearing free fluid within the pelvis, which may be physiologic. IMPRESSION: 1. No evidence of intrauterine pregnancy at this time. 2. Complex subcentimeter hypoechoic structure within the cervix. This is indeterminate, though an abnormal gestational sac could be considered given the positive beta HCG and lack of documented live intrauterine pregnancy. 3. Complex hyperechoic structure within the right ovary, favor hemorrhagic cyst or endometrioma. Ectopic pregnancy cannot be entirely excluded given the lack of documented intrauterine pregnancy. Recommendation: Serial beta HCG measurements and follow-up  ultrasound recommended to document live intrauterine pregnancy and exclude ectopic pregnancy. Electronically Signed   By: Randa Ngo M.D.   On: 02/13/2020 18:21   US OB Transvaginal  Result Date: 02/13/2020 CLINICAL DATA:  Vaginal bleeding, beta HCG 6,832, history of polycystic ovarian syndrome and endometriosis EXAM: OBSTETRIC <14 WK [place] AND TRANSVAGINAL OB US TECHNIQUE: Both transabdominal and transvaginal ultrasound examinations were performed for complete evaluation of the gestation as well as the maternal uterus, adnexal regions, and pelvic cul-de-sac. Transvaginal technique was performed to assess early pregnancy. COMPARISON:  None. FINDINGS: Intrauterine gestational sac: None Yolk sac:  Not Visualized. Embryo:  Not Visualized. Maternal uterus/adnexae: Uterus is anteverted measuring 8.6 x 4.5 x 5.1 cm. Endometrium measures 9 mm in thickness. Complex hypoechoic structure  is seen within the endocervical canal, measuring 0.9 x 0.9 x 0.9 cm. This is indeterminate. Multiple nabothian cysts are also noted. Right ovary measures 5.1 x 3.7 x 3.5 cm, with numerous small follicles identified. There is a 3.1 x 2.8 x 3.1 cm complex hyperechoic structure within the right ovary, displaying mild increased peripheral vascularity. This could reflect an endometrioma or hemorrhagic cyst. Ectopic pregnancy cannot be entirely excluded. The left ovary is unremarkable measuring 2.2 x 1.9 x 2.2 cm. There is trace simple appearing free fluid within the pelvis, which may be physiologic. IMPRESSION: 1. No evidence of intrauterine pregnancy at this time. 2. Complex subcentimeter hypoechoic structure within the cervix. This is indeterminate, though an abnormal gestational sac could be considered given the positive beta HCG and lack of documented live intrauterine pregnancy. 3. Complex hyperechoic structure within the right ovary, favor hemorrhagic cyst or endometrioma. Ectopic pregnancy cannot be entirely excluded given the lack of  documented intrauterine pregnancy. Recommendation: Serial beta HCG measurements and follow-up ultrasound recommended to document live intrauterine pregnancy and exclude ectopic pregnancy. Electronically Signed   By: Randa Ngo M.D.   On: 02/13/2020 18:21     MAU course/MDM: Orders Placed This Encounter  Procedures  . CBC  . hCG, quantitative, pregnancy  . Comprehensive metabolic panel  . hCG, quantitative, pregnancy  . Type and screen  . ABO/Rh  . Discharge patient    Pain and bleeding in early pregnancy with drop in Quant, but hemodynamically stable.  Uncertain if this represents miscarriage or ectopic pregnancy.  Instructed patient to follow-up may you morning of 02/18/2020.  Assessment: 1. Pregnancy of unknown anatomic location   2. Abdominal pain during pregnancy, first trimester   3. Vaginal bleeding in pregnancy, first trimester    Plan: Discharge home in stable condition. Ectopic and SAB precautions Follow-up Information     [facility] Follow up on 02/18/2020.   Specialty: Obstetrics and Gynecology Why: In the morning for repeat blood work or sooner as needed in Scientist, water quality information: [address] [id number] [place], [place] Follow up.   Why: Primary care and endocrinology Contact information: [place] [address] [phone]         Starlyn Skeans, MD Follow up.   Specialty: Family Medicine Why: As needed for post-bariatric surgery care Contact information: [address] [place] [address] [phone]           Allergies as of 02/15/2020   No Known Allergies      Medication List     TAKE these medications    ANTACID PO Take 2 tablets by mouth every other day.   CALCIUM CITRATE PO Take 1 tablet by mouth 3 (three) times daily.   FLINTSTONES COMPLETE PO Take 2 tablets by mouth daily.    VITAMIN B12 PO Take 1 tablet by mouth daily.        Tamala Julian, [place], [place] 02/15/2020, 10:26 PM  2/3

## 2020-02-15 NOTE — Discharge Instructions (Signed)
Your hCG level (pregnancy hormone level dropped from FO:[id number].  This is consistent with either miscarriage or ectopic pregnancy, but it is still not clear which is happening in your situation.  It is important that you follow-up for blood work on Saturday morning.  Return to maternity admissions for any severe abdominal pain.     Ectopic Pregnancy  An ectopic pregnancy is when the fertilized egg attaches (implants) outside the uterus. Most ectopic pregnancies occur in one of the tubes where eggs travel from the ovary to the uterus (fallopian tubes), but the implanting can occur in other locations. In rare cases, ectopic pregnancies occur on the ovary, intestine, pelvis, abdomen, or cervix. In an ectopic pregnancy, the fertilized egg does not have the ability to develop into a normal, healthy baby. A ruptured ectopic pregnancy is one in which tearing or bursting of a fallopian tube causes internal bleeding. Often, there is intense lower abdominal pain, and vaginal bleeding sometimes occurs. Having an ectopic pregnancy can be life-threatening. If this dangerous condition is not treated, it can lead to blood loss, shock, or even death. What are the causes? The most common cause of this condition is damage to one of the fallopian tubes. A fallopian tube may be narrowed or blocked, and that keeps the fertilized egg from reaching the uterus. What increases the risk? This condition is more likely to develop in women of childbearing age who have different levels of risk. The levels of risk can be divided into three categories. High risk  You have gone through infertility treatment.  You have had an ectopic pregnancy before.  You have had surgery on the fallopian tubes, or another surgical procedure, such as an abortion.  You have had surgery to have the fallopian tubes tied (tubal ligation).  You have problems or diseases of the fallopian tubes.  You have been exposed to diethylstilbestrol (DES).  This medicine was used until 1971, and it had effects on babies whose mothers took the medicine.  You become pregnant while using an IUD (intrauterine device) for birth control. Moderate risk  You have a history of infertility.  You have had an STI (sexually transmitted infection).  You have a history of pelvic inflammatory disease (PID).  You have scarring from endometriosis.  You have multiple sexual partners.  You smoke. Low risk  You have had pelvic surgery.  You use vaginal douches.  You became sexually active before age 64. What are the signs or symptoms? Common symptoms of this condition include normal pregnancy symptoms, such as missing a period, nausea, tiredness, abdominal pain, breast tenderness, and bleeding. However, ectopic pregnancy will have additional symptoms, such as:  Pain with intercourse.  Irregular vaginal bleeding or spotting.  Cramping or pain on one side or in the lower abdomen.  Fast heartbeat, low blood pressure, and sweating.  Passing out while having a bowel movement. Symptoms of a ruptured ectopic pregnancy and internal bleeding may include:  Sudden, severe pain in the abdomen and pelvis.  Dizziness, weakness, light-headedness, or fainting.  Pain in the shoulder or neck area. How is this diagnosed? This condition is diagnosed by:  A pelvic exam to locate pain or a mass in the abdomen.  A pregnancy test. This blood test checks for the presence as well as the specific level of pregnancy hormone in the bloodstream.  Ultrasound. This is performed if a pregnancy test is positive. In this test, a probe is inserted into the vagina. The probe will detect a fetus, possibly  in a location other than the uterus.  Taking a sample of uterus tissue (dilation and curettage, or D&C).  Surgery to perform a visual exam of the inside of the abdomen using a thin, lighted tube that has a tiny camera on the end (laparoscope).  Culdocentesis. This  procedure involves inserting a needle at the top of the vagina, behind the uterus. If blood is present in this area, it may indicate that a fallopian tube is torn. How is this treated? This condition is treated with medicine or surgery. Medicine  An injection of a medicine (methotrexate) may be given to cause the pregnancy tissue to be absorbed. This medicine may save your fallopian tube. It may be given if: ? The diagnosis is made early, with no signs of active bleeding. ? The fallopian tube has not ruptured. ? You are considered to be a good candidate for the medicine. Usually, pregnancy hormone blood levels are checked after methotrexate treatment. This is to be sure that the medicine is effective. It may take 4-6 weeks for the pregnancy to be absorbed. Most pregnancies will be absorbed by 3 weeks. Surgery  A laparoscope may be used to remove the pregnancy tissue.  If severe internal bleeding occurs, a larger cut (incision) may be made in the lower abdomen (laparotomy) to remove the fetus and placenta. This is done to stop the bleeding.  Part or all of the fallopian tube may be removed (salpingectomy) along with the fetus and placenta. The fallopian tube may also be repaired during the surgery.  In very rare circumstances, removal of the uterus (hysterectomy) may be required.  After surgery, pregnancy hormone testing may be done to be sure that there is no pregnancy tissue left. Whether your treatment is medicine or surgery, you may receive a Rho (D) immune globulin shot to prevent problems with any future pregnancy. This shot may be given if:  You are Rh-negative and the baby's father is Rh-positive.  You are Rh-negative and you do not know the Rh type of the baby's father. Follow these instructions at home:  Rest and limit your activity after the procedure for as long as told by your health care provider.  Until your health care provider says that it is safe: ? Do not lift  anything that is heavier than 10 lb (4.5 kg), or the limit that your health care provider tells you. ? Avoid physical exercise and any movement that requires effort (is strenuous).  To help prevent constipation: ? Eat a healthy diet that includes fruits, vegetables, and whole grains. ? Drink 6-8 glasses of water per day. Get help right away if:  You develop worsening pain that is not relieved by medicine.  You have: ? A fever or chills. ? Vaginal bleeding. ? Redness and swelling at the incision site. ? Nausea and vomiting.  You feel dizzy or weak.  You feel light-headed or you faint. This information is not intended to replace advice given to you by your health care provider. Make sure you discuss any questions you have with your health care provider. Document Revised: 08/21/2017 Document Reviewed: 04/09/2016 Elsevier Patient Education  [place].

## 2020-02-17 ENCOUNTER — Other Ambulatory Visit: Payer: Self-pay

## 2020-02-17 ENCOUNTER — Encounter (HOSPITAL_COMMUNITY): Payer: Self-pay | Admitting: Obstetrics and Gynecology

## 2020-02-17 ENCOUNTER — Observation Stay (HOSPITAL_COMMUNITY)
Admission: AD | Admit: 2020-02-17 | Discharge: 2020-02-18 | Disposition: A | Discharge status: Home / Self Care | Payer: Medicaid Other | Source: Ambulatory Visit | Attending: Obstetrics and Gynecology | Admitting: Obstetrics and Gynecology

## 2020-02-17 ENCOUNTER — Inpatient Hospital Stay (HOSPITAL_COMMUNITY): Payer: Medicaid Other | Admitting: Certified Registered Nurse Anesthetist

## 2020-02-17 ENCOUNTER — Inpatient Hospital Stay (HOSPITAL_COMMUNITY): Payer: Medicaid Other

## 2020-02-17 ENCOUNTER — Encounter (HOSPITAL_COMMUNITY)
Admission: AD | Disposition: A | Discharge status: Home / Self Care | Payer: Self-pay | Source: Ambulatory Visit | Attending: Obstetrics and Gynecology

## 2020-02-17 DIAGNOSIS — Z20822 Contact with and (suspected) exposure to covid-19: Secondary | ICD-10-CM | POA: Diagnosis not present

## 2020-02-17 DIAGNOSIS — Z3A00 Weeks of gestation of pregnancy not specified: Secondary | ICD-10-CM | POA: Diagnosis not present

## 2020-02-17 DIAGNOSIS — K661 Hemoperitoneum: Secondary | ICD-10-CM | POA: Diagnosis not present

## 2020-02-17 DIAGNOSIS — Z87891 Personal history of nicotine dependence: Secondary | ICD-10-CM | POA: Insufficient documentation

## 2020-02-17 DIAGNOSIS — D270 Benign neoplasm of right ovary: Secondary | ICD-10-CM | POA: Insufficient documentation

## 2020-02-17 DIAGNOSIS — O09529 Supervision of elderly multigravida, unspecified trimester: Secondary | ICD-10-CM

## 2020-02-17 DIAGNOSIS — O3680X0 Pregnancy with inconclusive fetal viability, not applicable or unspecified: Secondary | ICD-10-CM

## 2020-02-17 DIAGNOSIS — R001 Bradycardia, unspecified: Secondary | ICD-10-CM | POA: Diagnosis present

## 2020-02-17 DIAGNOSIS — O009 Unspecified ectopic pregnancy without intrauterine pregnancy: Secondary | ICD-10-CM

## 2020-02-17 DIAGNOSIS — N946 Dysmenorrhea, unspecified: Secondary | ICD-10-CM | POA: Diagnosis present

## 2020-02-17 DIAGNOSIS — Z9889 Other specified postprocedural states: Secondary | ICD-10-CM

## 2020-02-17 DIAGNOSIS — R1031 Right lower quadrant pain: Secondary | ICD-10-CM

## 2020-02-17 HISTORY — PX: LAPAROSCOPIC LYSIS OF ADHESIONS: SHX5905

## 2020-02-17 HISTORY — PX: LAPAROSCOPIC UNILATERAL SALPINGO OOPHERECTOMY: SHX5935

## 2020-02-17 HISTORY — DX: Unspecified ectopic pregnancy without intrauterine pregnancy: O00.90

## 2020-02-17 HISTORY — DX: Dysmenorrhea, unspecified: N94.6

## 2020-02-17 LAB — COMPREHENSIVE METABOLIC PANEL
ALT: 12 U/L (ref 0–44)
AST: 27 U/L (ref 15–41)
Albumin: 4.1 g/dL (ref 3.5–5.0)
Alkaline Phosphatase: 41 U/L (ref 38–126)
Anion gap: 7 (ref 5–15)
BUN: 18 mg/dL (ref 6–20)
CO2: 27 mmol/L (ref 22–32)
Calcium: 9.2 mg/dL (ref 8.9–10.3)
Chloride: 105 mmol/L (ref 98–111)
Creatinine, Ser: 0.61 mg/dL (ref 0.44–1.00)
GFR calc Af Amer: >60 mL/min (ref >60)
GFR calc non Af Amer: >60 mL/min (ref >60)
Glucose, Bld: 91 mg/dL (ref 70–99)
Potassium: 3.6 mmol/L (ref 3.5–5.1)
Sodium: 139 mmol/L (ref 135–145)
Total Bilirubin: 0.5 mg/dL (ref 0.3–1.2)
Total Protein: 6.8 g/dL (ref 6.5–8.1)

## 2020-02-17 LAB — CBC WITH DIFFERENTIAL/PLATELET
Abs Immature Granulocytes: 0.03 10*3/uL (ref 0.00–0.07)
Basophils Absolute: 0.1 10*3/uL (ref 0.0–0.1)
Basophils Relative: 1 %
Eosinophils Absolute: 0.1 10*3/uL (ref 0.0–0.5)
Eosinophils Relative: 1 %
HCT: 40.9 % (ref 36.0–46.0)
Hemoglobin: 13.5 g/dL (ref 12.0–15.0)
Immature Granulocytes: 0 %
Lymphocytes Relative: 29 %
Lymphs Abs: 3 10*3/uL (ref 0.7–4.0)
MCH: 31.1 pg (ref 26.0–34.0)
MCHC: 33 g/dL (ref 30.0–36.0)
MCV: 94.2 fL (ref 80.0–100.0)
Monocytes Absolute: 0.9 10*3/uL (ref 0.1–1.0)
Monocytes Relative: 9 %
Neutro Abs: 6 10*3/uL (ref 1.7–7.7)
Neutrophils Relative %: 60 %
Platelets: 235 10*3/uL (ref 150–400)
RBC: 4.34 MIL/uL (ref 3.87–5.11)
RDW: 12.6 % (ref 11.5–15.5)
WBC: 10 10*3/uL (ref 4.0–10.5)
nRBC: 0 % (ref 0.0–0.2)

## 2020-02-17 LAB — SARS CORONAVIRUS 2 BY RT PCR (HOSPITAL ORDER, PERFORMED IN ~~LOC~~ HOSPITAL LAB): SARS Coronavirus 2: NEGATIVE

## 2020-02-17 LAB — TYPE AND SCREEN
ABO/RH(D): O POS
Antibody Screen: NEGATIVE

## 2020-02-17 LAB — HCG, QUANTITATIVE, PREGNANCY: hCG, Beta Chain, Quant, S: 8188 m[IU]/mL — ABNORMAL HIGH (ref <5)

## 2020-02-17 SURGERY — SALPINGO-OOPHORECTOMY, UNILATERAL, LAPAROSCOPIC
Anesthesia: General | Laterality: Right

## 2020-02-17 MED ORDER — SCOPOLAMINE 1 MG/3DAYS TD PT72
MEDICATED_PATCH | TRANSDERMAL | Status: AC
  Administered 2020-02-17: 1.5 mg via TRANSDERMAL
  Filled 2020-02-17: qty 1

## 2020-02-17 MED ORDER — LACTATED RINGERS IV BOLUS
500.0000 mL | Freq: Once | INTRAVENOUS | Status: AC
  Administered 2020-02-17: 500 mL via INTRAVENOUS

## 2020-02-17 MED ORDER — DIPHENHYDRAMINE HCL 50 MG/ML IJ SOLN
INTRAMUSCULAR | Status: DC | PRN
Start: 2020-02-17 — End: 2020-02-17
  Administered 2020-02-17: 12.5 mg via INTRAVENOUS

## 2020-02-17 MED ORDER — ONDANSETRON HCL 4 MG/2ML IJ SOLN
INTRAMUSCULAR | Status: AC
  Filled 2020-02-17: qty 2

## 2020-02-17 MED ORDER — ONDANSETRON HCL 4 MG/2ML IJ SOLN
INTRAMUSCULAR | Status: DC | PRN
Start: 2020-02-17 — End: 2020-02-17
  Administered 2020-02-17: 4 mg via INTRAVENOUS

## 2020-02-17 MED ORDER — SILVER NITRATE-POT NITRATE 75-25 % EX MISC
CUTANEOUS | Status: AC
  Filled 2020-02-17: qty 10

## 2020-02-17 MED ORDER — CHLORHEXIDINE GLUCONATE 0.12 % MT SOLN
15.0000 mL | Freq: Once | OROMUCOSAL | Status: AC
  Administered 2020-02-17: 15 mL via OROMUCOSAL
  Filled 2020-02-17: qty 15

## 2020-02-17 MED ORDER — MIDAZOLAM HCL 2 MG/2ML IJ SOLN
INTRAMUSCULAR | Status: AC
  Filled 2020-02-17: qty 2

## 2020-02-17 MED ORDER — SUGAMMADEX SODIUM 200 MG/2ML IV SOLN
INTRAVENOUS | Status: DC | PRN
  Administered 2020-02-17: 200 mg via INTRAVENOUS

## 2020-02-17 MED ORDER — SIMETHICONE 80 MG PO CHEW
80.0000 mg | CHEWABLE_TABLET | Freq: Three times a day (TID) | ORAL | Status: DC
  Administered 2020-02-18: 80 mg via ORAL
  Filled 2020-02-17: qty 1

## 2020-02-17 MED ORDER — FENTANYL CITRATE (PF) 100 MCG/2ML IJ SOLN
25.0000 ug | INTRAMUSCULAR | Status: DC | PRN
  Administered 2020-02-17 (×2): 50 ug via INTRAVENOUS

## 2020-02-17 MED ORDER — ROCURONIUM BROMIDE 10 MG/ML (PF) SYRINGE
PREFILLED_SYRINGE | INTRAVENOUS | Status: AC
  Filled 2020-02-17: qty 10

## 2020-02-17 MED ORDER — 0.9 % SODIUM CHLORIDE (POUR BTL) OPTIME
TOPICAL | Status: DC | PRN
  Administered 2020-02-17: 1000 mL

## 2020-02-17 MED ORDER — BUPIVACAINE HCL 0.5 % IJ SOLN
INTRAMUSCULAR | Status: DC | PRN
  Administered 2020-02-17: 14 mL

## 2020-02-17 MED ORDER — POLYETHYLENE GLYCOL 3350 17 G PO PACK
17.0000 g | PACK | Freq: Every day | ORAL | Status: DC
  Administered 2020-02-18: 17 g via ORAL
  Filled 2020-02-17: qty 1

## 2020-02-17 MED ORDER — ARTIFICIAL TEARS OPHTHALMIC OINT
TOPICAL_OINTMENT | OPHTHALMIC | Status: AC
  Filled 2020-02-17: qty 3.5

## 2020-02-17 MED ORDER — ACETAMINOPHEN 325 MG PO TABS: 650.0000 mg | ORAL_TABLET | ORAL | Status: DC | PRN

## 2020-02-17 MED ORDER — LIDOCAINE 2% (20 MG/ML) 5 ML SYRINGE
INTRAMUSCULAR | Status: AC
  Filled 2020-02-17: qty 5

## 2020-02-17 MED ORDER — ONDANSETRON HCL 4 MG/2ML IJ SOLN: 4.0000 mg | Freq: Four times a day (QID) | INTRAMUSCULAR | Status: DC | PRN

## 2020-02-17 MED ORDER — DEXAMETHASONE SODIUM PHOSPHATE 10 MG/ML IJ SOLN
INTRAMUSCULAR | Status: AC
  Filled 2020-02-17: qty 1

## 2020-02-17 MED ORDER — DIPHENHYDRAMINE HCL 50 MG/ML IJ SOLN
INTRAMUSCULAR | Status: AC
  Filled 2020-02-17: qty 1

## 2020-02-17 MED ORDER — FENTANYL CITRATE (PF) 250 MCG/5ML IJ SOLN
INTRAMUSCULAR | Status: AC
  Filled 2020-02-17: qty 5

## 2020-02-17 MED ORDER — SUCCINYLCHOLINE CHLORIDE 200 MG/10ML IV SOSY
PREFILLED_SYRINGE | INTRAVENOUS | Status: AC
  Filled 2020-02-17: qty 10

## 2020-02-17 MED ORDER — EPHEDRINE SULFATE 50 MG/ML IJ SOLN
INTRAMUSCULAR | Status: DC | PRN
  Administered 2020-02-17: 5 mg via INTRAVENOUS

## 2020-02-17 MED ORDER — LIDOCAINE HCL (CARDIAC) PF 100 MG/5ML IV SOSY
PREFILLED_SYRINGE | INTRAVENOUS | Status: DC | PRN
  Administered 2020-02-17: 60 mg via INTRATRACHEAL

## 2020-02-17 MED ORDER — PROPOFOL 10 MG/ML IV BOLUS
INTRAVENOUS | Status: AC
  Filled 2020-02-17: qty 20

## 2020-02-17 MED ORDER — ROCURONIUM BROMIDE 100 MG/10ML IV SOLN
INTRAVENOUS | Status: DC | PRN
Start: 2020-02-17 — End: 2020-02-17
  Administered 2020-02-17: 50 mg via INTRAVENOUS

## 2020-02-17 MED ORDER — OXYCODONE-ACETAMINOPHEN 5-325 MG PO TABS
1.0000 | ORAL_TABLET | ORAL | Status: DC | PRN
  Administered 2020-02-17 – 2020-02-18 (×3): 1 via ORAL
  Filled 2020-02-17 (×3): qty 1

## 2020-02-17 MED ORDER — PHENYLEPHRINE 40 MCG/ML (10ML) SYRINGE FOR IV PUSH (FOR BLOOD PRESSURE SUPPORT)
PREFILLED_SYRINGE | INTRAVENOUS | Status: AC
  Filled 2020-02-17: qty 10

## 2020-02-17 MED ORDER — SODIUM CHLORIDE 0.9 % IR SOLN
Status: DC | PRN
  Administered 2020-02-17: 3000 mL

## 2020-02-17 MED ORDER — MIDAZOLAM HCL 5 MG/5ML IJ SOLN
INTRAMUSCULAR | Status: DC | PRN
  Administered 2020-02-17: 2 mg via INTRAVENOUS

## 2020-02-17 MED ORDER — ONDANSETRON HCL 4 MG PO TABS: 4.0000 mg | ORAL_TABLET | Freq: Four times a day (QID) | ORAL | Status: DC | PRN

## 2020-02-17 MED ORDER — ACETAMINOPHEN 500 MG PO TABS
1000.0000 mg | ORAL_TABLET | Freq: Once | ORAL | Status: AC
  Administered 2020-02-17: 1000 mg via ORAL
  Filled 2020-02-17: qty 2

## 2020-02-17 MED ORDER — BUPIVACAINE HCL (PF) 0.5 % IJ SOLN
INTRAMUSCULAR | Status: AC
  Filled 2020-02-17: qty 30

## 2020-02-17 MED ORDER — SCOPOLAMINE 1 MG/3DAYS TD PT72: 1.0000 | MEDICATED_PATCH | TRANSDERMAL | Status: DC

## 2020-02-17 MED ORDER — SUCCINYLCHOLINE CHLORIDE 20 MG/ML IJ SOLN
INTRAMUSCULAR | Status: DC | PRN
  Administered 2020-02-17: 100 mg via INTRAVENOUS

## 2020-02-17 MED ORDER — EPHEDRINE 5 MG/ML INJ
INTRAVENOUS | Status: AC
  Filled 2020-02-17: qty 10

## 2020-02-17 MED ORDER — PROMETHAZINE HCL 25 MG/ML IJ SOLN: 6.2500 mg | INTRAMUSCULAR | Status: DC | PRN

## 2020-02-17 MED ORDER — FENTANYL CITRATE (PF) 100 MCG/2ML IJ SOLN
INTRAMUSCULAR | Status: AC
  Filled 2020-02-17: qty 2

## 2020-02-17 MED ORDER — FENTANYL CITRATE (PF) 250 MCG/5ML IJ SOLN
INTRAMUSCULAR | Status: DC | PRN
  Administered 2020-02-17: 100 ug via INTRAVENOUS
  Administered 2020-02-17: 50 ug via INTRAVENOUS
  Administered 2020-02-17: 100 ug via INTRAVENOUS

## 2020-02-17 MED ORDER — LACTATED RINGERS IV SOLN: INTRAVENOUS | Status: DC | PRN

## 2020-02-17 MED ORDER — LACTATED RINGERS IV SOLN: INTRAVENOUS | Status: DC

## 2020-02-17 MED ORDER — PROPOFOL 10 MG/ML IV BOLUS
INTRAVENOUS | Status: DC | PRN
  Administered 2020-02-17: 160 mg via INTRAVENOUS

## 2020-02-17 SURGICAL SUPPLY — 40 items
APPLICATOR COTTON TIP 6 STRL (MISCELLANEOUS) ×2 IMPLANT
APPLICATOR COTTON TIP 6IN STRL (MISCELLANEOUS) ×4
BLADE SURG 15 STRL LF DISP TIS (BLADE) ×2 IMPLANT
BLADE SURG 15 STRL SS (BLADE) ×2
CABLE HIGH FREQUENCY MONO STRZ (ELECTRODE) IMPLANT
DEFOGGER SCOPE WARMER CLEARIFY (MISCELLANEOUS) ×4 IMPLANT
DERMABOND ADVANCED (GAUZE/BANDAGES/DRESSINGS) ×2
DERMABOND ADVANCED .7 DNX12 (GAUZE/BANDAGES/DRESSINGS) ×2 IMPLANT
DRSG OPSITE POSTOP 3X4 (GAUZE/BANDAGES/DRESSINGS) ×4 IMPLANT
DURAPREP 26ML APPLICATOR (WOUND CARE) ×4 IMPLANT
ELECT REM PT RETURN 9FT ADLT (ELECTROSURGICAL) ×4
ELECTRODE REM PT RTRN 9FT ADLT (ELECTROSURGICAL) ×2 IMPLANT
GLOVE BIOGEL PI IND STRL 7.0 (GLOVE) ×4 IMPLANT
GLOVE BIOGEL PI IND STRL 7.5 (GLOVE) ×4 IMPLANT
GLOVE BIOGEL PI INDICATOR 7.0 (GLOVE) ×4
GLOVE BIOGEL PI INDICATOR 7.5 (GLOVE) ×4
GLOVE SURG SS PI 7.0 STRL IVOR (GLOVE) ×4 IMPLANT
GOWN STRL REUS W/ TWL LRG LVL3 (GOWN DISPOSABLE) ×6 IMPLANT
GOWN STRL REUS W/TWL LRG LVL3 (GOWN DISPOSABLE) ×6
KIT TURNOVER KIT B (KITS) ×4 IMPLANT
LIGASURE VESSEL 5MM BLUNT TIP (ELECTROSURGICAL) ×4 IMPLANT
NS IRRIG 1000ML POUR BTL (IV SOLUTION) ×4 IMPLANT
PACK LAPAROSCOPY BASIN (CUSTOM PROCEDURE TRAY) ×4 IMPLANT
PACK TRENDGUARD 450 HYBRID PRO (MISCELLANEOUS) ×2 IMPLANT
PAD OB MATERNITY 4.3X12.25 (PERSONAL CARE ITEMS) ×4 IMPLANT
POUCH LAPAROSCOPIC INSTRUMENT (MISCELLANEOUS) ×8 IMPLANT
POUCH SPECIMEN RETRIEVAL 10MM (ENDOMECHANICALS) ×4 IMPLANT
PROTECTOR NERVE ULNAR (MISCELLANEOUS) ×8 IMPLANT
SCISSORS LAP 5X35 DISP (ENDOMECHANICALS) IMPLANT
SET IRRIG TUBING LAPAROSCOPIC (IRRIGATION / IRRIGATOR) ×4 IMPLANT
SET TUBE SMOKE EVAC HIGH FLOW (TUBING) ×4 IMPLANT
SLEEVE ADV FIXATION 5X100MM (TROCAR) ×4 IMPLANT
SUT MON AB 4-0 PS1 27 (SUTURE) ×4 IMPLANT
SUT VICRYL 0 UR6 27IN ABS (SUTURE) ×4 IMPLANT
SYR 10ML LL (SYRINGE) ×4 IMPLANT
TOWEL GREEN STERILE FF (TOWEL DISPOSABLE) ×8 IMPLANT
TRAY FOLEY W/BAG SLVR 14FR (SET/KITS/TRAYS/PACK) ×4 IMPLANT
TRENDGUARD 450 HYBRID PRO PACK (MISCELLANEOUS) ×4
TROCAR ADV FIXATION 5X100MM (TROCAR) ×4 IMPLANT
TROCAR BALLN 12MMX100 BLUNT (TROCAR) ×4 IMPLANT

## 2020-02-17 NOTE — Anesthesia Procedure Notes (Signed)
Procedure Name: Intubation Date/Time: 02/17/2020 8:03 PM Performed by: Clovis Cao, CRNA Pre-anesthesia Checklist: Patient identified, Emergency Drugs available, Suction available, Patient being monitored and Timeout performed Patient Re-evaluated:Patient Re-evaluated prior to induction Oxygen Delivery Method: Circle system utilized Preoxygenation: Pre-oxygenation with 100% oxygen Induction Type: IV induction, Rapid sequence and Cricoid Pressure applied Laryngoscope Size: Miller and 2 Grade View: Grade I Tube type: Oral Tube size: 7.0 mm Number of attempts: 1 Airway Equipment and Method: Stylet Placement Confirmation: ETT inserted through vocal cords under direct vision,  positive ETCO2 and breath sounds checked- equal and bilateral Secured at: 21 cm Tube secured with: Tape Dental Injury: Teeth and Oropharynx as per pre-operative assessment

## 2020-02-17 NOTE — MAU Note (Signed)
Having pain in RLQ, started about 56min ago.  Feeling pressure on her back side. Feeling bloated into upper abd. Passed a dimesize clots.  Still bleeding, day 26 .

## 2020-02-17 NOTE — Transfer of Care (Signed)
Immediate Anesthesia Transfer of Care Note  Patient: Stacey Ramos  Procedure(s) Performed: LAPAROSCOPIC RIGHT SALPINGO OOPHORECTOMY WITH REMOVAL OF ECTOPIC PREGNANCY (Right ) Laparoscopic Lysis Of Adhesions  Patient Location: PACU  Anesthesia Type:General  Level of Consciousness: drowsy  Airway & Oxygen Therapy: Patient Spontanous Breathing and Patient connected to face mask oxygen  Post-op Assessment: Report given to RN and Post -op Vital signs reviewed and stable  Post vital signs: Reviewed and stable  Last Vitals:  Vitals Value Taken Time  BP    Temp    Pulse 82 02/17/20 2149  Resp 28 02/17/20 2149  SpO2 100 % 02/17/20 2149  Vitals shown include unvalidated device data.  Last Pain:  Vitals:   02/17/20 1730  TempSrc:   PainSc: 8          Complications: No apparent anesthesia complications

## 2020-02-17 NOTE — Op Note (Addendum)
Operative Note   02/17/2020  PRE-OP DIAGNOSIS *Right adnexal ectopic pregnancy   POST-OP DIAGNOSIS *Same *Hemoperitoneum    SURGEON: Surgeon(s) and Role:    Stacey Halim, MD - Primary  ASSISTANT: None  PROCEDURE: Diagnostic laparoscopic, lysis of adhesions approximately 30 minutes, right salpingoophorectomy   ANESTHESIA: General and local  ESTIMATED BLOOD LOSS: 53mL for the case. 25mL of hemoperitoneum  DRAINS: Indwelling foley 317mL UOP   TOTAL IV FLUIDS: 1297mL crystalloid  VTE PROPHYLAXIS: SCDs to the bilateral lower extremities  ANTIBIOTICS: None indicated  SPECIMENS: right tube and ovary  DISPOSITION: PACU - hemodynamically stable.  CONDITION: stable  COMPLICATIONS: None  FINDINGS: Normal EGBUS, normal vagina. On diagnostic laparoscopy, normal liver and gallbladder edge. Unable to see stomach edge. No intra-abdominal adhesions except in the pelvis. In the pelvis, grossly normal uterus, engorged right fallopian tube that was denseley adeherent to the right ovary with some clot coming out of the adnexal complex. +hemoperitoneum. Normal right ureter. Left ovary was grossly normal, left tube appeared abnormal with likely spots of endometriosis on it and the left tube looked slightly dilated but not enough to call it a hydrosalpingx. The left tube was also mild to moderately adherent to the left ovary with a somewhat tortuous course.   DESCRIPTION OF PROCEDURE: After informed consent was obtained, the patient was taken to the operating room where anesthesia was obtained without difficulty. The patient was positioned in the dorsal lithotomy position in [name] and her arms were carefully tucked at her sides and the usual precautions were taken.  She was prepped and draped in normal sterile fashion.  Time-out was performed and a Foley catheter was placed into the bladder. A Hulka uterine manipulator was then placed in the uterus without incident. Gloves were then  changed, and after injection of local anesthesia, the open technique was used to place an infraumbilical [id number] baloon trocar under direct visualization. The laparoscope was introduced and CO2 gas was infused for pneumoperitoneum to a pressure of 15 mm Hg and the area below inspected for injury.  The patient was placed in Trendelenburg and the bowel was displaced up into the upper abdomen, and the left lateral and suprapubic 5-mm ports were placed under direct visualization of the laparoscope, after injection of local anesthesia.    The right tube and ovary were adhesed to each other; because of this, the decision was made to remove the tube and ovary.  The right adnexal complex was separated from the uterus and the pelvic brim. The ureter was then identified and located well away from the right IP ligament and courses down into the pelvis. The right utero-ovarian ligament was then cauterized and cut with the ligasure and serially cauterized to the IP. The IP was then serially cauterized and cut.  Using an endocatch bag, the specimen and any loose tissue was sent to pathology.  The 12mm ports were then removed under direct visualization.  Before the umbilical trocar was removed the CO2 gas was released.  The fascia there was closed with 0 vicryl suture.  The skin incision at the umbilicus was closed with a subcuticular stitch of 4-0 monocryl.  The remaining skin incisions were closed with Dermabond glue.  The patient tolerated the procedure well.  Sponge, lap and needle counts were correct x2.  The patient was taken to recovery room in excellent condition.   Prior to discharge, post op findings were discussed with her and I recommend against future pregnancies. I told her if she  desires to conceive, to call us ASAP if she becomes pregnant in the future to follow blood levels and u/s   Stacey Romans MD Attending Center for [employer] [facility])

## 2020-02-17 NOTE — Anesthesia Preprocedure Evaluation (Addendum)
Anesthesia Evaluation  Patient identified by MRN, date of birth, ID band Patient awake    Reviewed: Allergy & Precautions, NPO status , Patient's Chart, lab work & pertinent test results  Airway Mallampati: II  TM Distance: >3 FB Neck ROM: Full    Dental no notable dental hx. (+) Dental Advisory Given   Pulmonary neg pulmonary ROS, former smoker,    Pulmonary exam normal        Cardiovascular negative cardio ROS Normal cardiovascular exam  IMPRESSIONS     1. Left ventricular ejection fraction, by visual estimation, is 60 to 65%. The left ventricle has normal function. There is no left ventricular hypertrophy. 2. The left ventricle has no regional wall motion abnormalities. 3. Global right ventricle has normal systolic function.The right ventricular size is normal. No increase in right ventricular wall thickness. 4. Left atrial size was normal. 5. Right atrial size was normal. 6. The mitral valve is normal in structure. No evidence of mitral valve regurgitation. No evidence of mitral stenosis. 7. The tricuspid valve is normal in structure. 8. The aortic valve is normal in structure. Aortic valve regurgitation is not visualized. No evidence of aortic valve sclerosis or stenosis. 9. The pulmonic valve was normal in structure. Pulmonic valve regurgitation is not visualized. 10. Normal pulmonary artery systolic pressure. 11. The inferior vena cava is dilated in size with >50% respiratory variability, suggesting right atrial pressure of 8 mmHg.   Neuro/Psych negative neurological ROS     GI/Hepatic negative GI ROS, Neg liver ROS,   Endo/Other  negative endocrine ROS  Renal/GU negative Renal ROS     Musculoskeletal negative musculoskeletal ROS (+)   Abdominal   Peds  Hematology negative hematology ROS (+)   Anesthesia Other Findings   Reproductive/Obstetrics                            Anesthesia  Physical Anesthesia Plan  ASA: II and emergent  Anesthesia Plan: General   Post-op Pain Management:    Induction: Intravenous  PONV Risk Score and Plan: 4 or greater and Ondansetron, Dexamethasone, Scopolamine patch - Pre-op and Midazolam  Airway Management Planned: Oral ETT  Additional Equipment:   Intra-op Plan:   Post-operative Plan: Extubation in OR  Informed Consent: I have reviewed the patients History and Physical, chart, labs and discussed the procedure including the risks, benefits and alternatives for the proposed anesthesia with the patient or authorized representative who has indicated his/her understanding and acceptance.     Dental advisory given  Plan Discussed with: Anesthesiologist and CRNA  Anesthesia Plan Comments:        Anesthesia Quick Evaluation

## 2020-02-17 NOTE — Progress Notes (Signed)
MAU Note Patient presenting to MAU with intense RLQ. Staff concerned enough to order stat pelvic u/s on arrival. MAU note from 5/26 not completed so I'm unsure of what happened at that visit.  Patient states she's a 39 y/o G2P1001 with LMP of 5/3. She states her periods are qmonth and regular and she was not on anything for birth control and is actively trying to conceive. She states that she's had VB since her period and abdominal pain and went to the [facility] ED at that time. At that time they said it was 10/10. Her quant was [phone] and she had an u/s that showed something in the right adnexa (see below). Per the note, it states that they spoke with Dr. Roselie Awkward who recommend 48 hour beta hcg follow up.  CLINICAL DATA:  Vaginal bleeding, beta HCG 6,832, history of polycystic ovarian syndrome and endometriosis  EXAM: OBSTETRIC <14 WK [place] AND TRANSVAGINAL OB US  TECHNIQUE: Both transabdominal and transvaginal ultrasound examinations were performed for complete evaluation of the gestation as well as the maternal uterus, adnexal regions, and pelvic cul-de-sac. Transvaginal technique was performed to assess early pregnancy.  COMPARISON:  None.  FINDINGS: Intrauterine gestational sac: None  Yolk sac:  Not Visualized.  Embryo:  Not Visualized.  Maternal uterus/adnexae: Uterus is anteverted measuring 8.6 x 4.5 x 5.1 cm. Endometrium measures 9 mm in thickness.  Complex hypoechoic structure is seen within the endocervical canal, measuring 0.9 x 0.9 x 0.9 cm. This is indeterminate. Multiple nabothian cysts are also noted.  Right ovary measures 5.1 x 3.7 x 3.5 cm, with numerous small follicles identified. There is a 3.1 x 2.8 x 3.1 cm complex hyperechoic structure within the right ovary, displaying mild increased peripheral vascularity. This could reflect an endometrioma or hemorrhagic cyst. Ectopic pregnancy cannot be entirely excluded.  The left ovary is unremarkable measuring  2.2 x 1.9 x 2.2 cm.  There is trace simple appearing free fluid within the pelvis, which may be physiologic.  IMPRESSION: 1. No evidence of intrauterine pregnancy at this time. 2. Complex subcentimeter hypoechoic structure within the cervix. This is indeterminate, though an abnormal gestational sac could be considered given the positive beta HCG and lack of documented live intrauterine pregnancy. 3. Complex hyperechoic structure within the right ovary, favor hemorrhagic cyst or endometrioma. Ectopic pregnancy cannot be entirely excluded given the lack of documented intrauterine pregnancy.  Recommendation: Serial beta HCG measurements and follow-up ultrasound recommended to document live intrauterine pregnancy and exclude ectopic pregnancy.   Electronically Signed   By: Randa Ngo M.D.   On: 02/13/2020 18:21  She presented to MAU on 5/26 and her beta was slightly down at 6448 with a normal and stable cbc and cmp; no repeat u/s was done.   The patient re-presented today due to still having some VB and having intense RLQ pain.  CBC and CMP are still normal and stable and u/s doesn't see anything now with just small amount of free fluid. Her beta hcg is 8188  She states that she has intense dysmenorrhea and her s/s feel similar to that.  I told her that she definitely has an abnormal pregnancy b/c her beta hcg went down. I also told her it's concerning that her [place] Brown is now 8200 but there is still nothing in the uterus. I told her that I recommend surgical treatment and to remove any structures that look abnormal, such as her tubes or ovaries.  I told her that there is increase surgical  risk due to her history of endometriosis and ablation procedures but I told her that I don't feel she is an MTx candidate due to multiple factors. I told her that I understand that she wants to conceive but she has a pregnancy of unknown location with pain, even though she looks comfortable to me and  has no peritoneal s/s she is still mildly ttp in the low belly, she needs surgical management. Pt understands. She last ate at 11am (egg sandwich) so will plan for diagnostic laparoscopy, possible right salpingoophorectomy, all indicated procedures at 1900 today.  She states that she doesn't have a ride home (she drove herself) and her significant other has to stay with a family member that needs 24/7 care. I told her she may need 23 hour obs if she can't find a ride home   Aletha Halim, Brooke Bonito MD Attending Center for [employer] (Faculty Practice) 02/17/2020 Time: 1640 [phone]

## 2020-02-17 NOTE — MAU Provider Note (Signed)
History     CSN: [id number]  Arrival date and time: 02/17/20 1336   First Provider Initiated Contact with Patient 02/17/20 1404      Chief Complaint  Patient presents with  . Pelvic Pain   Stacey Ramos is a 39 y.o. G2P1000 at  59w6dwho presents today with vaginal bleeding and pain. She states that she has been bleeding since Jan 23, 2020. She started having pain on 02/13/2020. She was seen in the ED on 02/13/2020 for the pain that was the time she found out she was pregnant. She has PCOS and endometriosis. She has very irregular periods so was not thinking she was pregnant. She had an ablation in September 16, 2018 [place]. She has not had a BTL and she is not using anything for birth control. She is planning/attemping a pregnancy.   Pelvic Pain The patient's primary symptoms include pelvic pain and vaginal bleeding. The patient's pertinent negatives include no vaginal discharge. This is a new problem. The current episode started in the past 7 days. The problem occurs constantly. The problem has been gradually worsening. The problem affects the right side. She is pregnant. Pertinent negatives include no chills, dysuria, fever, frequency, nausea or vomiting. The vaginal bleeding is lighter than menses. She has not been passing clots. She has not been passing tissue. Nothing aggravates the symptoms. She has tried nothing for the symptoms. She uses nothing for contraception. Menstrual history: LNMP 4/3-12/28/19.    OB History    Gravida  2   Para  1   Term  1   Preterm      AB      Living        SAB      TAB      Ectopic      Multiple      Live Births              Past Medical History:  Diagnosis Date  . Asthma   . Endometriosis   . Iron deficiency anemia   . Polycystic ovarian syndrome     Past Surgical History:  Procedure Laterality Date  . BARIATRIC SURGERY  2017  . ENDOMETRIAL ABLATION  2019  . TONSILLECTOMY    . WISDOM TOOTH EXTRACTION       Family History  Problem Relation Age of Onset  . Diabetes Mother   . Multiple myeloma Father   . Colon cancer Father   . Diabetes Father   . Stroke Father   . Aneurysm Father   . Diabetes Maternal Grandmother   . Stroke Paternal Grandmother   . Breast cancer Paternal Grandmother   . Cancer Paternal Grandmother        breast  . Colon cancer Paternal Grandfather     Social History   Tobacco Use  . Smoking status: Former Smoker    Types: Cigarettes  . Smokeless tobacco: Never Used  Substance Use Topics  . Alcohol use: Not Currently  . Drug use: Not Currently    Allergies: No Known Allergies  Medications Prior to Admission  Medication Sig Dispense Refill Last Dose  . Calcium Carbonate Antacid (ANTACID PO) Take 2 tablets by mouth every other day.      .Marland KitchenCALCIUM CITRATE PO Take 1 tablet by mouth 3 (three) times daily.     . Cyanocobalamin (VITAMIN B12 PO) Take 1 tablet by mouth daily.     . Pediatric Multivitamins-Iron (FLINTSTONES COMPLETE PO) Take 2 tablets by  mouth daily.       Review of Systems  Constitutional: Negative for chills and fever.  Gastrointestinal: Negative for nausea and vomiting.  Genitourinary: Positive for pelvic pain and vaginal bleeding. Negative for dysuria, frequency and vaginal discharge.   Physical Exam   Blood pressure (!) 103/54, pulse 67, temperature 98.8 F (37.1 C), temperature source Oral, resp. rate 20, height '5\' 4"'  (1.626 m), weight 84.1 kg, last menstrual period 12/24/2019, SpO2 100 %.  Physical Exam  Nursing note and vitals reviewed. Constitutional: She is oriented to person, place, and time. She appears well-developed and well-nourished. She appears distressed.  HENT:  Head: Normocephalic.  Cardiovascular: Normal rate.  Respiratory: Effort normal.  GI: There is abdominal tenderness. There is rebound.  Neurological: She is alert and oriented to person, place, and time.  Skin: Skin is warm and dry.  Psychiatric: She has a  normal mood and affect.     Results for orders placed or performed during the hospital encounter of 02/17/20 (from the past 24 hour(s))  CBC with Differential/Platelet     Status: None   Collection Time: 02/17/20  2:10 PM  Result Value Ref Range   WBC 10.0 4.0 - 10.5 K/uL   RBC 4.34 3.87 - 5.11 MIL/uL   Hemoglobin 13.5 12.0 - 15.0 g/dL   HCT 40.9 36.0 - 46.0 %   MCV 94.2 80.0 - 100.0 fL   MCH 31.1 26.0 - 34.0 pg   MCHC 33.0 30.0 - 36.0 g/dL   RDW 12.6 11.5 - 15.5 %   Platelets 235 150 - 400 K/uL   nRBC 0.0 0.0 - 0.2 %   Neutrophils Relative % 60 %   Neutro Abs 6.0 1.7 - 7.7 K/uL   Lymphocytes Relative 29 %   Lymphs Abs 3.0 0.7 - 4.0 K/uL   Monocytes Relative 9 %   Monocytes Absolute 0.9 0.1 - 1.0 K/uL   Eosinophils Relative 1 %   Eosinophils Absolute 0.1 0.0 - 0.5 K/uL   Basophils Relative 1 %   Basophils Absolute 0.1 0.0 - 0.1 K/uL   Immature Granulocytes 0 %   Abs Immature Granulocytes 0.03 0.00 - 0.07 K/uL  hCG, quantitative, pregnancy     Status: Abnormal   Collection Time: 02/17/20  2:10 PM  Result Value Ref Range   hCG, Beta Chain, Quant, S 8,188 (H) <5 mIU/mL  Comprehensive metabolic panel     Status: None   Collection Time: 02/17/20  2:10 PM  Result Value Ref Range   Sodium 139 135 - 145 mmol/L   Potassium 3.6 3.5 - 5.1 mmol/L   Chloride 105 98 - 111 mmol/L   CO2 27 22 - 32 mmol/L   Glucose, Bld 91 70 - 99 mg/dL   BUN 18 6 - 20 mg/dL   Creatinine, Ser 0.61 0.44 - 1.00 mg/dL   Calcium 9.2 8.9 - 10.3 mg/dL   Total Protein 6.8 6.5 - 8.1 g/dL   Albumin 4.1 3.5 - 5.0 g/dL   AST 27 15 - 41 U/L   ALT 12 0 - 44 U/L   Alkaline Phosphatase 41 38 - 126 U/L   Total Bilirubin 0.5 0.3 - 1.2 mg/dL   GFR calc non Af Amer >60 >60 mL/min   GFR calc Af Amer >60 >60 mL/min   Anion gap 7 5 - 15  Type and screen     Status: None   Collection Time: 02/17/20  2:10 PM  Result Value Ref Range   ABO/RH(D) Jenetta Downer  POS    Antibody Screen NEG    Sample Expiration       02/20/2020,2359 Performed at [facility], [place] [address] [place], [place] [address]    US OB Transvaginal  Result Date: 02/17/2020 CLINICAL DATA:  Pelvic pain in first trimester of pregnancy, LMP 12/24/2019, quantitative beta hCG pending EXAM: TRANSVAGINAL OB ULTRASOUND TECHNIQUE: Transvaginal ultrasound was performed for complete evaluation of the gestation as well as the maternal uterus, adnexal regions, and pelvic cul-de-sac. COMPARISON:  02/13/2020 FINDINGS: Intrauterine gestational sac: Absent Yolk sac:  N/A Embryo:  N/A Cardiac Activity: N/A Heart Rate: N/A bpm MSD:   mm    w     d CRL:     mm    w  d                  [place] EDC: Subchorionic hemorrhage:  N/A Maternal uterus/adnexae: Uterus anteverted, normal appearance. No uterine mass or gestational sac identified. Small nabothian cysts at cervix. Small amount of free fluid seen in pelvis. RIGHT ovary measures 2.4 x 1.7 x 2.3 cm and contains a possible corpus luteum. LEFT ovary normal size and morphology 4.4 x 2.3 x 1.6 cm. No adnexal masses. IMPRESSION: Small amount of nonspecific free fluid in pelvis. No intrauterine gestation identified. Findings are consistent with pregnancy of unknown location. Differential diagnosis includes early intrauterine pregnancy too early to visualize, spontaneous abortion, and ectopic pregnancy; recommend correlation with quantitative beta HCG. Serial quantitative beta HCG and or follow-up ultrasound recommended to definitively exclude ectopic pregnancy. Electronically Signed   By: Lavonia Dana M.D.   On: 02/17/2020 15:30     MAU Course  Procedures  MDM 4:28 PM Dr. Ilda Basset has been to assess the patient. Plan to take patient to OR at this time  Assessment and Plan  Pregnancy of unknown location with severe pain and abdominal tenderness  HCG levels are fluctuating No pregnancy seen on [place] today  Concern for occult ectopic pregnancy Patient to [place], CNM  02/17/20  2:12 PM

## 2020-02-17 NOTE — Anesthesia Postprocedure Evaluation (Signed)
Anesthesia Post Note  Patient: Stacey Ramos  Procedure(s) Performed: LAPAROSCOPIC RIGHT SALPINGO OOPHORECTOMY WITH REMOVAL OF ECTOPIC PREGNANCY (Right ) Laparoscopic Lysis Of Adhesions     Patient location during evaluation: PACU Anesthesia Type: General Level of consciousness: sedated Pain management: pain level controlled Vital Signs Assessment: post-procedure vital signs reviewed and stable Respiratory status: spontaneous breathing and respiratory function stable Cardiovascular status: stable Postop Assessment: no apparent nausea or vomiting Anesthetic complications: no    Last Vitals:  Vitals:   02/17/20 2240 02/17/20 2301  BP: (!) 99/53 (!) 96/48  Pulse: 75 62  Resp: 17 17  Temp:  36.8 C  SpO2: 96% 100%    Last Pain:  Vitals:   02/17/20 2301  TempSrc: Oral  PainSc:                  Demetris Meinhardt DANIEL

## 2020-02-18 DIAGNOSIS — N736 Female pelvic peritoneal adhesions (postinfective): Secondary | ICD-10-CM | POA: Diagnosis not present

## 2020-02-18 DIAGNOSIS — K661 Hemoperitoneum: Secondary | ICD-10-CM | POA: Diagnosis not present

## 2020-02-18 DIAGNOSIS — O008 Other ectopic pregnancy without intrauterine pregnancy: Secondary | ICD-10-CM

## 2020-02-18 MED ORDER — POLYETHYLENE GLYCOL 3350 17 G PO PACK: 17.0000 g | PACK | Freq: Every day | ORAL | 0 refills | Status: AC

## 2020-02-18 MED ORDER — OXYCODONE-ACETAMINOPHEN 5-325 MG PO TABS: 1.0000 | ORAL_TABLET | ORAL | 0 refills | Status: DC | PRN

## 2020-02-18 MED ORDER — SIMETHICONE 80 MG PO CHEW: 80.0000 mg | CHEWABLE_TABLET | Freq: Three times a day (TID) | ORAL | 0 refills | Status: DC

## 2020-02-18 NOTE — Discharge Summary (Signed)
Gynecology Discharge Summary Date of Admission: 02/17/2020 Date of Discharge: 02/18/2020  The patient was admitted, as scheduled, and underwent a diagnostic laparoscopic, lysis of adhesions approximately 30 minutes, right salpingoophorectomy; please refer to operative note for full details.  She was meeting all post op goals and discharged to home on POD#1 because she had to stay overnight due to transportation issues.  Prior to discharge, post op findings were discussed with her and I recommend against future pregnancies. I told her if she desires to conceive, to call us ASAP if she becomes pregnant in the future to follow blood levels and u/s   Allergies as of 02/18/2020   No Known Allergies     Medication List    TAKE these medications   ANTACID PO Take 2 tablets by mouth every other day.   CALCIUM CITRATE PO Take 1 tablet by mouth 3 (three) times daily.   CHEWABLE IRON PO Take 29 mg by mouth.   FLINTSTONES COMPLETE PO Take 2 tablets by mouth daily.   folic acid [id number] MCG tablet Commonly known as: FOLVITE Take 800 mcg by mouth daily.   oxyCODONE-acetaminophen 5-325 MG tablet Commonly known as: PERCOCET/ROXICET Take 1 tablet by mouth every 4 (four) hours as needed for severe pain ((when tolerating fluids)).   polyethylene glycol 17 g packet Commonly known as: MIRALAX / GLYCOLAX Take 17 g by mouth daily for 14 days.   simethicone 80 MG chewable tablet Commonly known as: MYLICON Chew 1 tablet (80 mg total) by mouth 3 (three) times daily for 7 days.   VITAMIN B12 PO Take 1 tablet by mouth daily.       No future appointments.  [name] MD Attending Center for [facility])

## 2020-02-18 NOTE — Plan of Care (Signed)
  Problem: Education: Goal: Knowledge of General Education information will improve Description Including pain rating scale, medication(s)/side effects and non-pharmacologic comfort measures Outcome: Progressing   

## 2020-02-18 NOTE — Discharge Instructions (Signed)
Laparoscopic Surgery Discharge Instructions ° ° °You have just undergone a  laparoscopic surgery.  The following list should answer your most common questions.  Although we will discuss your surgery and post-operative instructions with you prior to your discharge, this list will serve as a reminder if you fail to recall the details of what we discussed. ° °We will discuss your surgery once again in detail at your post-op visit in two to four weeks. If you haven’t already done so, please call to make your appointment as soon as possible. ° °How you will feel: Although you have just undergone a major surgery, your recovery will be significantly shorter since the surgery was performed through much smaller incisions than the traditional approach.  You should feel slightly better each day.  If you suddenly feel much worse than the prior day, please call the clinic.  It’s important during the early part of your recovery that you maintain some activity.  Walking is encouraged.  You will quicken your recovery by continued activity. ° °Incision:  Your incisions will be closed with dissolvable stitches or surgical adhesive (glue).  There may be Band-aids and/or Steri-strips covering your incisions.  If there is no drainage from the incisions you may remove the Band-aids in one to two days.  You may notice some minor bruising at the incision sites.  This is common and will resolve within several days.  Please inform us if the redness at the edges of your incision appears to be spreading.  If the skin around your incision becomes warm to the touch, or if you notice a pus-like drainage, please call the office. ° °Stairs/Driving/Activities: You may climb stairs if necessary.  If you’ve had general anesthesia, do not drive a car the rest of the day today.  You may begin light housework when you feel up to it, but avoid heavy lifting (more than 15-20lbs) or pushing until cleared for these activities by your physician. ° °Hygiene:   Do not soak your incisions.  Showers are acceptable but you may not take a bath or swim in a pool.  Cleanse your incisions daily with soap and water. ° °Medications:  Please resume taking any medications that you were taking prior to the surgery.  If we have prescribed any new medications for you, please take them as directed. ° °Constipation:  It is fairly common to experience some difficulty in moving your bowels following major surgery.  Being active will help to reduce this likelihood. A diet rich in fiber and plenty of liquids is desirable.  If you do become constipated, a mild laxative such as Miralax, Milk of Magnesia, or Metamucil, or a stool softener such as Colace, is recommended. ° °General Instructions: If you develop a fever of 100.5 degrees or higher, please call the office number(s) below for physician on call. ° ° ° °

## 2020-02-18 NOTE — Progress Notes (Signed)
Discharged home to day, patient's boyfriend driving her home. Discharged instructions,personal belongings given to patient. Advised to pick up medications called in to pharmacy of choice.Verbalized understanding of instructions

## 2020-02-21 LAB — SURGICAL PATHOLOGY

## 2020-02-22 ENCOUNTER — Emergency Department (HOSPITAL_COMMUNITY)
Admission: EM | Admit: 2020-02-22 | Discharge: 2020-02-22 | Disposition: A | Discharge status: Home / Self Care | Payer: Medicaid Other | Attending: Emergency Medicine | Admitting: Emergency Medicine

## 2020-02-22 ENCOUNTER — Emergency Department (HOSPITAL_COMMUNITY): Payer: Medicaid Other

## 2020-02-22 ENCOUNTER — Encounter (HOSPITAL_COMMUNITY): Payer: Self-pay

## 2020-02-22 ENCOUNTER — Other Ambulatory Visit: Payer: Self-pay

## 2020-02-22 DIAGNOSIS — G8918 Other acute postprocedural pain: Secondary | ICD-10-CM | POA: Insufficient documentation

## 2020-02-22 DIAGNOSIS — Z79899 Other long term (current) drug therapy: Secondary | ICD-10-CM | POA: Diagnosis not present

## 2020-02-22 DIAGNOSIS — R1033 Periumbilical pain: Secondary | ICD-10-CM | POA: Diagnosis present

## 2020-02-22 DIAGNOSIS — Z87891 Personal history of nicotine dependence: Secondary | ICD-10-CM | POA: Diagnosis not present

## 2020-02-22 DIAGNOSIS — J45909 Unspecified asthma, uncomplicated: Secondary | ICD-10-CM | POA: Insufficient documentation

## 2020-02-22 DIAGNOSIS — R52 Pain, unspecified: Secondary | ICD-10-CM

## 2020-02-22 LAB — CBC
HCT: 41.1 % (ref 36.0–46.0)
Hemoglobin: 13.6 g/dL (ref 12.0–15.0)
MCH: 31.6 pg (ref 26.0–34.0)
MCHC: 33.1 g/dL (ref 30.0–36.0)
MCV: 95.4 fL (ref 80.0–100.0)
Platelets: 262 10*3/uL (ref 150–400)
RBC: 4.31 MIL/uL (ref 3.87–5.11)
RDW: 12.4 % (ref 11.5–15.5)
WBC: 6.5 10*3/uL (ref 4.0–10.5)
nRBC: 0 % (ref 0.0–0.2)

## 2020-02-22 LAB — COMPREHENSIVE METABOLIC PANEL
ALT: 13 U/L (ref 0–44)
AST: 25 U/L (ref 15–41)
Albumin: 4.3 g/dL (ref 3.5–5.0)
Alkaline Phosphatase: 41 U/L (ref 38–126)
Anion gap: 10 (ref 5–15)
BUN: 15 mg/dL (ref 6–20)
CO2: 27 mmol/L (ref 22–32)
Calcium: 8.9 mg/dL (ref 8.9–10.3)
Chloride: 103 mmol/L (ref 98–111)
Creatinine, Ser: 0.6 mg/dL (ref 0.44–1.00)
GFR calc Af Amer: >60 mL/min (ref >60)
GFR calc non Af Amer: >60 mL/min (ref >60)
Glucose, Bld: 88 mg/dL (ref 70–99)
Potassium: 4 mmol/L (ref 3.5–5.1)
Sodium: 140 mmol/L (ref 135–145)
Total Bilirubin: 0.6 mg/dL (ref 0.3–1.2)
Total Protein: 7.5 g/dL (ref 6.5–8.1)

## 2020-02-22 LAB — LACTIC ACID, PLASMA
Lactic Acid, Venous: 1.1 mmol/L (ref 0.5–1.9)
Lactic Acid, Venous: 0.8 mmol/L (ref 0.5–1.9)

## 2020-02-22 LAB — URINALYSIS, ROUTINE W REFLEX MICROSCOPIC
Bacteria, UA: NONE SEEN
Bilirubin Urine: NEGATIVE
Glucose, UA: NEGATIVE mg/dL
Hgb urine dipstick: NEGATIVE
Ketones, ur: NEGATIVE mg/dL
Nitrite: NEGATIVE
Protein, ur: NEGATIVE mg/dL
Specific Gravity, Urine: 1.011 (ref 1.005–1.030)
pH: 8 (ref 5.0–8.0)

## 2020-02-22 LAB — LIPASE, BLOOD: Lipase: 25 U/L (ref 11–51)

## 2020-02-22 MED ORDER — SODIUM CHLORIDE 0.9% FLUSH
3.0000 mL | Freq: Once | INTRAVENOUS | Status: AC
  Administered 2020-02-22: 3 mL via INTRAVENOUS

## 2020-02-22 MED ORDER — IOHEXOL 300 MG/ML  SOLN
100.0000 mL | Freq: Once | INTRAMUSCULAR | Status: AC | PRN
  Administered 2020-02-22: 100 mL via INTRAVENOUS

## 2020-02-22 MED ORDER — MORPHINE SULFATE (PF) 4 MG/ML IV SOLN
4.0000 mg | Freq: Once | INTRAVENOUS | Status: AC
  Administered 2020-02-22: 4 mg via INTRAVENOUS
  Filled 2020-02-22: qty 1

## 2020-02-22 MED ORDER — OXYCODONE HCL 5 MG PO TABS: 5.0000 mg | ORAL_TABLET | ORAL | 0 refills | Status: DC | PRN

## 2020-02-22 MED ORDER — SODIUM CHLORIDE (PF) 0.9 % IJ SOLN
INTRAMUSCULAR | Status: AC
  Filled 2020-02-22: qty 50

## 2020-02-22 NOTE — ED Notes (Signed)
Patient transported to CT 

## 2020-02-22 NOTE — ED Triage Notes (Signed)
Patient states she had an ectopic pregnancy and had her tube removed 5 days ago.  Today, patient c/o umbilical pain and burning. Patient states, "when I stand up, it feels like the bottom part of my stomach feels like it is coming apart and have a lot of burning."

## 2020-02-22 NOTE — ED Provider Notes (Signed)
[facility] DEPT Provider Note   CSN: [id number] Arrival date & time: 02/22/20  1126     History Chief Complaint  Patient presents with  . umbilicus pain    Stacey Ramos is a 39 y.o. female.  The history is provided by the patient and medical records. No language interpreter was used.  Abdominal Pain Pain location:  Periumbilical Pain quality: aching and sharp   Pain radiates to:  Does not radiate Pain severity:  Severe Onset quality:  Gradual Duration:  2 days Timing:  Intermittent Progression:  Waxing and waning Chronicity:  New Context: previous surgery   Relieved by:  Nothing Worsened by:  Palpation Ineffective treatments:  None tried Associated symptoms: no chest pain, no chills, no constipation, no cough, no diarrhea, no dysuria, no fatigue, no fever, no nausea, no shortness of breath, no vaginal bleeding, no vaginal discharge and no vomiting        Past Medical History:  Diagnosis Date  . Asthma   . Dysmenorrhea   . Endometriosis   . Iron deficiency anemia   . Polycystic ovarian syndrome   . Syncope due to orthostatic hypotension 09/17/2019    Patient Active Problem List   Diagnosis Date Noted  . Dysmenorrhea 02/17/2020  . Pregnancy of unknown anatomic location 02/17/2020  . Bradycardia 02/17/2020  . RLQ abdominal pain 02/17/2020  . AMA (advanced maternal age) multigravida 35+ 02/17/2020  . Ectopic pregnancy 02/17/2020  . Status post surgery 02/17/2020    Past Surgical History:  Procedure Laterality Date  . ABLATION ON ENDOMETRIOSIS  2019  . APPENDECTOMY     as a child. open procedure  . 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 LAPAROSCOPIC LYSIS OF ADHESIONS  02/17/2020   Procedure: Laparoscopic Lysis Of Adhesions;  Surgeon: Aletha Halim, MD;  Location: [facility];  Service: Gynecology;;  . LAPAROSCOPIC UNILATERAL SALPINGO OOPHERECTOMY Right 02/17/2020   Procedure: LAPAROSCOPIC RIGHT SALPINGO OOPHORECTOMY  WITH REMOVAL OF ECTOPIC PREGNANCY;  Surgeon: Aletha Halim, MD;  Location: [facility];  Service: Gynecology;  Laterality: Right;  . TONSILLECTOMY    . WISDOM TOOTH EXTRACTION       OB History    Gravida  2   Para  1   Term  1   Preterm      AB      Living        SAB      TAB      Ectopic      Multiple      Live Births              Family History  Problem Relation Age of Onset  . Diabetes Mother   . Multiple myeloma Father   . Colon cancer Father   . Diabetes Father   . Stroke Father   . Aneurysm Father   . Diabetes Maternal Grandmother   . Stroke Paternal Grandmother   . Breast cancer Paternal Grandmother   . Cancer Paternal Grandmother        breast  . Colon cancer Paternal Grandfather     Social History   Tobacco Use  . Smoking status: Former Smoker    Types: Cigarettes  . Smokeless tobacco: Never Used  Substance Use Topics  . Alcohol use: Not Currently  . Drug use: Not Currently    Home Medications Prior to Admission medications   Medication Sig Start Date End Date Taking? Authorizing Provider  Calcium Carbonate Antacid (ANTACID PO)  Take 2 tablets by mouth every other day.     [provider]  CALCIUM CITRATE PO Take 1 tablet by mouth 3 (three) times daily.    [provider]  Cyanocobalamin (VITAMIN B12 PO) Take 1 tablet by mouth daily.    [provider]  folic acid (FOLVITE) 992 MCG tablet Take 800 mcg by mouth daily.    [provider]  Iron Carbonyl-Vitamin C-FOS (CHEWABLE IRON PO) Take 29 mg by mouth.    [provider]  oxyCODONE-acetaminophen (PERCOCET/ROXICET) 5-325 MG tablet Take 1 tablet by mouth every 4 (four) hours as needed for severe pain ((when tolerating fluids)). 02/18/20   Aletha Halim, MD  Pediatric Multivitamins-Iron [facility] COMPLETE PO) Take 2 tablets by mouth daily.    [provider]  polyethylene glycol (MIRALAX / GLYCOLAX) 17 g packet Take 17 g by mouth daily  for 14 days. 02/18/20 03/03/20  Aletha Halim, MD  simethicone (MYLICON) 80 MG chewable tablet Chew 1 tablet (80 mg total) by mouth 3 (three) times daily for 7 days. 02/18/20 02/25/20  Aletha Halim, MD    Allergies    Patient has no known allergies.  Review of Systems   Review of Systems  Constitutional: Negative for chills, diaphoresis, fatigue and fever.  HENT: Negative for congestion.   Eyes: Negative for visual disturbance.  Respiratory: Negative for cough, chest tightness and shortness of breath.   Cardiovascular: Negative for chest pain.  Gastrointestinal: Positive for abdominal pain. Negative for constipation, diarrhea, nausea and vomiting.  Genitourinary: Negative for decreased urine volume, dysuria, flank pain, frequency, pelvic pain, urgency, vaginal bleeding, vaginal discharge and vaginal pain.  Musculoskeletal: Negative for back pain and neck pain.  Neurological: Negative for light-headedness and headaches.  Psychiatric/Behavioral: Negative for agitation.  All other systems reviewed and are negative.   Physical Exam Updated Vital Signs BP 94/72 (BP Location: Left Arm)   Pulse (!) 57   Temp 98.2 F (36.8 C) (Oral)   Resp 18   Ht 5' 4" (1.626 m)   Wt 81.6 kg   LMP 12/24/2019 (Exact Date)   SpO2 100%   BMI 30.90 kg/m   Physical Exam Vitals and nursing note reviewed.  Constitutional:      General: She is not in acute distress.    Appearance: She is well-developed. She is not ill-appearing, toxic-appearing or diaphoretic.  HENT:     Head: Normocephalic and atraumatic.     Nose: No congestion or rhinorrhea.     Mouth/Throat:     Mouth: Mucous membranes are moist.     Pharynx: No oropharyngeal exudate or posterior oropharyngeal erythema.  Eyes:     Extraocular Movements: Extraocular movements intact.     Conjunctiva/sclera: Conjunctivae normal.     Pupils: Pupils are equal, round, and reactive to light.  Cardiovascular:     Rate and Rhythm: Normal rate and  regular rhythm.     Heart sounds: No murmur.  Pulmonary:     Effort: Pulmonary effort is normal. No respiratory distress.     Breath sounds: Normal breath sounds.  Abdominal:     General: Abdomen is flat. A surgical scar is present. Bowel sounds are normal.     Palpations: Abdomen is soft.     Tenderness: There is abdominal tenderness. There is no right CVA tenderness, left CVA tenderness, guarding or rebound.    Genitourinary:    Comments: Deferred by patient initially Musculoskeletal:        General: No tenderness or signs  of injury.     Cervical back: Neck supple.     Right lower leg: No edema.     Left lower leg: No edema.  Skin:    General: Skin is warm and dry.     Capillary Refill: Capillary refill takes less than 2 seconds.     Findings: No erythema.  Neurological:     General: No focal deficit present.     Mental Status: She is alert.  Psychiatric:        Mood and Affect: Mood normal.     ED Results / Procedures / Treatments   Labs (all labs ordered are listed, but only abnormal results are displayed) Labs Reviewed  URINALYSIS, ROUTINE W REFLEX MICROSCOPIC - Abnormal; Notable for the following components:      Result Value   Leukocytes,Ua TRACE (*)    All other components within normal limits  URINE CULTURE  CULTURE, BLOOD (ROUTINE X 2)  CULTURE, BLOOD (ROUTINE X 2)  LIPASE, BLOOD  COMPREHENSIVE METABOLIC PANEL  CBC  LACTIC ACID, PLASMA  LACTIC ACID, PLASMA    EKG None  Radiology CT ABDOMEN PELVIS W CONTRAST  Result Date: 02/22/2020 CLINICAL DATA:  39 year old female with history of acute and nonlocalized abdominal pain. Status post surgery for right-sided ectopic pregnancy with right-sided salpingo-oophorectomy 5 days ago. Umbilical tenderness. EXAM: CT ABDOMEN AND PELVIS WITH CONTRAST TECHNIQUE: Multidetector CT imaging of the abdomen and pelvis was performed using the standard protocol following bolus administration of intravenous contrast. CONTRAST:   163m OMNIPAQUE IOHEXOL 300 MG/ML  SOLN COMPARISON:  No priors. FINDINGS: Lower chest: Unremarkable. Hepatobiliary: No suspicious cystic or solid hepatic lesions. No intra or extrahepatic biliary ductal dilatation. Gallbladder is normal in appearance. Pancreas: No pancreatic mass. No pancreatic ductal dilatation. No pancreatic or peripancreatic fluid collections or inflammatory changes. Spleen: Unremarkable. Adrenals/Urinary Tract: Bilateral kidneys and adrenal glands are normal in appearance. No hydroureteronephrosis. Urinary bladder is normal in appearance. Stomach/Bowel: Postoperative changes of Roux-en-Y gastric bypass. No pathologic dilatation of small bowel or colon. Normal appendix. Vascular/Lymphatic: No significant atherosclerotic disease, aneurysm or dissection noted in the abdominal or pelvic vasculature. No lymphadenopathy noted in the abdomen or pelvis. Reproductive: Uterus and left ovary are unremarkable in appearance. Right ovary is not visualized, compatible with recent history of surgical resection. Other: Trace volume of free fluid in the cul-de-sac, within normal limits in this postoperative patient. No pneumoperitoneum. Musculoskeletal: Mild soft tissue stranding and small amount of gas in the subcutaneous fat adjacent to the umbilical region, within normal limits in this recently postoperative patient. No well-defined fluid collection in the subcutaneous fat to suggest an abscess at this time. There are no aggressive appearing lytic or blastic lesions noted in the visualized portions of the skeleton. IMPRESSION: 1. Expected postoperative findings of recent laparoscopic right-sided salpingo-oophorectomy, without definite acute findings. Electronically Signed   By: DVinnie LangtonM.D.   On: 02/22/2020 15:18    Procedures Procedures (including critical care time)  Medications Ordered in ED Medications  sodium chloride flush (NS) 0.9 % injection 3 mL (3 mLs Intravenous Given 02/22/20 1306)    morphine 4 MG/ML injection 4 mg (4 mg Intravenous Given 02/22/20 1306)  iohexol (OMNIPAQUE) 300 MG/ML solution 100 mL (100 mLs Intravenous Contrast Given 02/22/20 1420)  sodium chloride (PF) 0.9 % injection (  Given by Other 02/22/20 1416)    ED Course  I have reviewed the triage vital signs and the nursing notes.  Pertinent labs & imaging results that were available  during my care of the patient were reviewed by me and considered in my medical decision making (see chart for details).    MDM Rules/Calculators/A&P                      Stacey Ramos is a 39 y.o. female past medical history significant for asthma, endometriosis, prior appendectomy, prior gastric bypass, and recent ectopic pregnancy status post right-sided salpingo-oophorectomy on 02/09/2020 (5 days ago) who presents with periumbilical abdominal pain.  Patient reports that she was discharged several days ago from the hospital and her vaginal bleeding has stopped.  She reports no pelvic pain or vaginal pain whatsoever.  She says she is not even having right lower quadrant pain but started having pain near the surgical site in her periumbilicus area.  She reports the pain is severe, 9 out of 10 in severity.  She reports the home pain medicine has not been working and it has worsened over the last 2 days.  She reports no nausea, vomiting, fevers, or chills.  She denies any urinary symptoms.  She denies any cough, congestion, or other respiratory problems.  She reports that the pain is worsened when she tries to stand or move.  She reports the surgical site has not any bleeding but is tender to the area.  She has no history of hernias.  No other complaints.  On exam, abdomen is tender near the umbilicus.  No flank tenderness or lower abdominal tenderness.  She initially refused pelvic exam as her pain is more in the upper abdomen and mid abdomen and not in the pelvis.  She would like to do the other work-up before doing a pelvic exam.  She had  clear lungs and back was nontender.  Flanks nontender.  No bleeding seen.  Clinically I think patient is either having postoperative.  Would like his pain at the surgical site versus a nonpalpable hernia versus postoperative complication or infection.  We will get screening labs as well as do a CT scan to look for abnormalities.  She will be given pain medicine and remain n.p.o.  At her request, due to lack of any pelvic symptoms, we will hold on pelvic exam or ultrasound.  Anticipate reassessment after work-up.  CT scan showed no postoperative complication and no abscess.  There is expected amount of subcutaneous gas.  No abscess seen.  No hernia.  Imaging otherwise reassuring.  Labs also reassuring.  I spoke with Dr. Roselie Awkward with the OB/GYN team who reviewed the case and feels she is safe for discharge home.  He initially wanted me to give some anti-inflammatory medication such as ibuprofen however, patient reports he is already take this due to her gastric bypass surgery and ulcers.  Instead, we will give her prescription for oxycodone without Tylenol and she will take Tylenol on her own at home to help augment her pain regimen.  She reports only has 1 Percocet left.  She will call her OB/GYN team tomorrow to discuss close follow-up.  Patient otherwise agreed with plan of care and was discharged in good condition.    Final Clinical Impression(s) / ED Diagnoses Final diagnoses:  Pain associated with wound  Acute periumbilical pain    Rx / DC Orders ED Discharge Orders         Ordered    oxyCODONE (ROXICODONE) 5 MG immediate release tablet  Every 4 hours PRN     02/22/20 1553  Clinical Impression: 1. Pain associated with wound   2. Acute periumbilical pain     Disposition: Discharge  Condition: Good  I have discussed the results, Dx and Tx plan with the pt(& family if present). He/she/they expressed understanding and agree(s) with the plan. Discharge instructions  discussed at great length. Strict return precautions discussed and pt &/or family have verbalized understanding of the instructions. No further questions at time of discharge.    New Prescriptions   OXYCODONE (ROXICODONE) 5 MG IMMEDIATE RELEASE TABLET    Take 1 tablet (5 mg total) by mouth every 4 (four) hours as needed for severe pain.    Follow Up: Aletha Halim, MD [address] [place] [address] [facility] [place] [id number] [place]       Babacar Haycraft, Gwenyth Allegra, MD 02/22/20 1759

## 2020-02-22 NOTE — Discharge Instructions (Signed)
Your history and exam today are consistent with postoperative and postsurgical pain on your surgical site.  The CT scan did not show abscess or other surgical complication.  There is no evidence of new hernia.  Please medicine regimen we discussed and use the oxycodone without the Tylenol and augmented with oral Tylenol at home.  Please call your OB/GYN team tomorrow to discuss closer follow-up and pain regimen.  Please rest and stay hydrated.  If any symptoms change or worsen, please return to the nearest emergency department.

## 2020-02-23 LAB — URINE CULTURE: Culture: 20000 — AB

## 2020-02-24 ENCOUNTER — Encounter (HOSPITAL_COMMUNITY): Payer: Self-pay

## 2020-02-24 ENCOUNTER — Telehealth: Payer: Self-pay | Admitting: Emergency Medicine

## 2020-02-24 ENCOUNTER — Other Ambulatory Visit: Payer: Self-pay

## 2020-02-24 ENCOUNTER — Ambulatory Visit (HOSPITAL_COMMUNITY)
Admission: EM | Admit: 2020-02-24 | Discharge: 2020-02-24 | Disposition: A | Discharge status: Home / Self Care | Payer: Medicaid Other | Attending: Physician Assistant | Admitting: Physician Assistant

## 2020-02-24 DIAGNOSIS — T8149XA Infection following a procedure, other surgical site, initial encounter: Secondary | ICD-10-CM | POA: Diagnosis not present

## 2020-02-24 MED ORDER — MUPIROCIN CALCIUM 2 % EX CREA
1.0000 | TOPICAL_CREAM | Freq: Two times a day (BID) | CUTANEOUS | 0 refills | Status: DC
Start: 2020-02-24 — End: 2021-06-18

## 2020-02-24 NOTE — Discharge Instructions (Signed)
Apply the bactroban ointment 2 times a day to the surgical wound site. Change dressings when doing this  Notify your surgical office of this plan of care and follow up with them for further re-evaluation next week  If pain increases, you notice discharge, fever, chills or other worsening symptoms return or go to the emergency department

## 2020-02-24 NOTE — ED Triage Notes (Signed)
Patient had a laparoscopic procedure for a tubal pregnancy 1 week ago. Last night noticed a foul smell from the surgical site, concerned about infection.Denies drainage, increased, pain, swelling, redness. Contacted triage nurse with surgery who told her to come here.

## 2020-02-24 NOTE — ED Provider Notes (Signed)
Stacey Ramos    CSN: [id number] Arrival date & time: 02/24/20  1502      History   Chief Complaint Chief Complaint  Patient presents with  . Abdominal Pain    HPI Stacey Ramos is a 38 y.o. female.   Patient who is roughly 1 week status post laparoscopic right-sided salpingo-oophorectomy with removal of ectopic pregnancy presenting with concern of foul odor around her umbilical surgical site.  She noted the odor yesterday.  She reports she was in emergency department on 02/22/2020 for abdominal pain and surgical site pain.  At that time she had full work-up and was reassuring for no intra-abdominal issues and given pain medicine.  She reports at the time she did not notice the smell and neither did emergency medical staff.  She reports no additional or increased abdominal pain.  She does not note any discharge from any of the 3 laparoscopic surgical sites.  She reports she does not have surgery follow-up until 03/16/2020.  She reports she called the surgical triage line and was told to come in her urgent care for evaluation  She reports she is moving her bowels daily without issue.  Denies nausea, vomiting or diarrhea.  Denies fever or chills.  Denies dysuria, urgency or frequency.  Denies vaginal discharge or pain.     Past Medical History:  Diagnosis Date  . Asthma   . Dysmenorrhea   . Endometriosis   . Iron deficiency anemia   . Polycystic ovarian syndrome   . Syncope due to orthostatic hypotension 09/17/2019    Patient Active Problem List   Diagnosis Date Noted  . Dysmenorrhea 02/17/2020  . Pregnancy of unknown anatomic location 02/17/2020  . Bradycardia 02/17/2020  . RLQ abdominal pain 02/17/2020  . AMA (advanced maternal age) multigravida 35+ 02/17/2020  . Ectopic pregnancy 02/17/2020  . Status post surgery 02/17/2020    Past Surgical History:  Procedure Laterality Date  . ABLATION ON ENDOMETRIOSIS  2019  . APPENDECTOMY     as a child. open procedure    . 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 LAPAROSCOPIC LYSIS OF ADHESIONS  02/17/2020   Procedure: Laparoscopic Lysis Of Adhesions;  Surgeon: Aletha Halim, MD;  Location: [place];  Service: Gynecology;;  . LAPAROSCOPIC UNILATERAL SALPINGO OOPHERECTOMY Right 02/17/2020   Procedure: LAPAROSCOPIC RIGHT SALPINGO OOPHORECTOMY WITH REMOVAL OF ECTOPIC PREGNANCY;  Surgeon: Aletha Halim, MD;  Location: [place];  Service: Gynecology;  Laterality: Right;  . TONSILLECTOMY    . WISDOM TOOTH EXTRACTION      OB History    Gravida  2   Para  1   Term  1   Preterm      AB      Living        SAB      TAB      Ectopic      Multiple      Live Births               Home Medications    Prior to Admission medications   Medication Sig Start Date End Date Taking? Authorizing Provider  oxyCODONE (ROXICODONE) 5 MG immediate release tablet Take 1 tablet (5 mg total) by mouth every 4 (four) hours as needed for severe pain. 02/22/20  Yes Tegeler, Gwenyth Allegra, MD  Calcium Carbonate Antacid (ANTACID PO) Take 2 tablets by mouth every other day.     [provider]  CALCIUM CITRATE PO Take 1 tablet  by mouth 3 (three) times daily.    [provider]  Cyanocobalamin (VITAMIN B12 PO) Take 1 tablet by mouth daily.    [provider]  docusate sodium (COLACE) 100 MG capsule Take 100 mg by mouth every evening.    [provider]  folic acid (FOLVITE) 568 MCG tablet Take 800 mcg by mouth daily.    [provider]  Iron Carbonyl-Vitamin C-FOS (CHEWABLE IRON PO) Take 29 mg by mouth daily.     [provider]  mupirocin cream (BACTROBAN) 2 % Apply 1 application topically 2 (two) times daily. 02/24/20   Zacharie Portner, Marguerita Beards, PA-C  oxyCODONE-acetaminophen (PERCOCET/ROXICET) 5-325 MG tablet Take 1 tablet by mouth every 4 (four) hours as needed for severe pain ((when tolerating fluids)). 02/18/20   Aletha Halim, MD  Pediatric Multivitamins-Iron  [facility] COMPLETE PO) Take 2 tablets by mouth daily.    [provider]  polyethylene glycol (MIRALAX / GLYCOLAX) 17 g packet Take 17 g by mouth daily for 14 days. 02/18/20 03/03/20  Aletha Halim, MD  simethicone (MYLICON) 80 MG chewable tablet Chew 1 tablet (80 mg total) by mouth 3 (three) times daily for 7 days. 02/18/20 02/25/20  Aletha Halim, MD    Family History Family History  Problem Relation Age of Onset  . Diabetes Mother   . Multiple myeloma Father   . Colon cancer Father   . Diabetes Father   . Stroke Father   . Aneurysm Father   . Diabetes Maternal Grandmother   . Stroke Paternal Grandmother   . Breast cancer Paternal Grandmother   . Cancer Paternal Grandmother        breast  . Colon cancer Paternal Grandfather     Social History Social History   Tobacco Use  . Smoking status: Former Smoker    Types: Cigarettes  . Smokeless tobacco: Never Used  Substance Use Topics  . Alcohol use: Not Currently  . Drug use: Not Currently     Allergies   Patient has no known allergies.   Review of Systems Review of Systems   Physical Exam Triage Vital Signs ED Triage Vitals  Enc Vitals Group     BP 02/24/20 1534 (!) 100/53     Pulse Rate 02/24/20 1534 64     Resp 02/24/20 1534 16     Temp 02/24/20 1534 98.4 F (36.9 C)     Temp Source 02/24/20 1534 Oral     SpO2 02/24/20 1534 100 %     Weight --      Height --      Head Circumference --      Peak Flow --      Pain Score 02/24/20 1532 5     Pain Loc --      Pain Edu? --      Excl. in [place]? --    No data found.  Updated Vital Signs BP (!) 100/53 (BP Location: Right Arm)   Pulse 64   Temp 98.4 F (36.9 C) (Oral)   Resp 16   LMP 12/24/2019 (Exact Date)   SpO2 100%   Visual Acuity Right Eye Distance:   Left Eye Distance:   Bilateral Distance:    Right Eye Near:   Left Eye Near:    Bilateral Near:     Physical Exam Vitals and nursing note reviewed.  Constitutional:      General:  She is not in acute distress.    Appearance: She is well-developed. She is not  ill-appearing, toxic-appearing or diaphoretic.  HENT:     Head: Normocephalic and atraumatic.  Eyes:     Conjunctiva/sclera: Conjunctivae normal.  Cardiovascular:     Rate and Rhythm: Normal rate.  Pulmonary:     Effort: Pulmonary effort is normal. No respiratory distress.  Abdominal:     Palpations: Abdomen is soft.     Tenderness: There is abdominal tenderness (There is mild tenderness however patient reports this is improved since visit to emergency department).     Hernia: No hernia is present.     Comments: Surgical Tegaderm bandage in place over umbilical surgical site.  There is a foul odor present.  Tegaderm removed, local surgical site evaluated.  Probed with cotton tip applicator, with definitive bottom.  No spreading erythema or discharge noted.  Left lower right lower abdominal laparoscopic surgical scars are well-healing without discharge.  Musculoskeletal:     Cervical back: Neck supple.  Skin:    General: Skin is warm and dry.  Neurological:     Mental Status: She is alert.      UC Treatments / Results  Labs (all labs ordered are listed, but only abnormal results are displayed) Labs Reviewed - No data to display  EKG   Radiology No results found.  Procedures Procedures (including critical care time)  Medications Ordered in UC Medications - No data to display  Initial Impression / Assessment and Plan / UC Course  I have reviewed the triage vital signs and the nursing notes.  Pertinent labs & imaging results that were available during my care of the patient were reviewed by me and considered in my medical decision making (see chart for details).     #Superficial postoperative wound infection Patient is a 39 year old with what appears to be mild superficial postoperative wound infection.  Given aroma, likely anaerobic and secondary to sealed bandage.  There are no obvious  signs of spreading cellulitis, abdominal exam is relatively benign.  Patient had CT and blood work in ED visit all reassuring for lack of intra-abdominal infection.  She is afebrile with normal vital signs and well-appearing.  We applied hydroperoxide and a bandage.  Instructed patient to utilize Bactroban and contact daily bandage changing.  Patient to follow-up with her OB/GYN office early next week for discussion of plan of care.  Strict emergency department return precautions were discussed. -Patient was seen and evaluated with attending physician on shift, Dr. Meda Coffee.  Plan of care was discussed with attending as well.  Final Clinical Impressions(s) / UC Diagnoses   Final diagnoses:  Superficial postoperative wound infection     Discharge Instructions     Apply the bactroban ointment 2 times a day to the surgical wound site. Change dressings when doing this  Notify your surgical office of this plan of care and follow up with them for further re-evaluation next week  If pain increases, you notice discharge, fever, chills or other worsening symptoms return or go to the emergency department    ED Prescriptions    Medication Sig Dispense Auth. Provider   mupirocin cream (BACTROBAN) 2 % Apply 1 application topically 2 (two) times daily. 15 g Stacey Ramos, Marguerita Beards, PA-C     PDMP not reviewed this encounter.   Purnell Shoemaker, PA-C 02/24/20 1646

## 2020-02-24 NOTE — Telephone Encounter (Signed)
Post ED Visit - Positive Culture Follow-up  Culture report reviewed by antimicrobial stewardship pharmacist: [place] Team []  Elenor Quinones, Pharm.D. []  Heide Guile, Pharm.D., BCPS AQ-ID []  Parks Neptune, Pharm.D., BCPS []  Alycia Rossetti, Pharm.D., BCPS []  [place], Pharm.D., BCPS, AAHIVP []  Legrand Como, Pharm.D., BCPS, AAHIVP []  Salome Arnt, PharmD, BCPS []  Johnnette Gourd, PharmD, BCPS []  Hughes Better, PharmD, BCPS []  Leeroy Cha, PharmD []  Laqueta Linden, PharmD, BCPS []  Albertina Parr, PharmD  [place] Team []  Leodis Sias, PharmD []  Lindell Spar, PharmD [x]  Royetta Asal, PharmD []  Graylin Shiver, Rph []  Rema Fendt) Glennon Mac, PharmD []  Arlyn Dunning, PharmD []  Netta Cedars, PharmD []  Dia Sitter, PharmD []  Leone Haven, PharmD []  Gretta Arab, PharmD []  Theodis Shove, PharmD []  Peggyann Juba, PharmD []  Reuel Boom, PharmD   Positive urine culture No further patient follow-up is required at this time.  Sandi Raveling Jameela Michna 02/24/2020, 1:52 PM

## 2020-02-27 LAB — CULTURE, BLOOD (ROUTINE X 2)
Culture: NO GROWTH
Culture: NO GROWTH
Special Requests: ADEQUATE

## 2020-03-16 ENCOUNTER — Encounter: Payer: Self-pay | Admitting: Obstetrics and Gynecology

## 2020-03-16 ENCOUNTER — Other Ambulatory Visit: Payer: Self-pay

## 2020-03-16 ENCOUNTER — Ambulatory Visit (INDEPENDENT_AMBULATORY_CARE_PROVIDER_SITE_OTHER): Payer: Medicaid Other | Admitting: Obstetrics and Gynecology

## 2020-03-16 VITALS — BP 119/89 | HR 64 | Ht 64.0 in | Wt 186.4 lb

## 2020-03-16 DIAGNOSIS — Z09 Encounter for follow-up examination after completed treatment for conditions other than malignant neoplasm: Secondary | ICD-10-CM

## 2020-03-16 NOTE — Progress Notes (Signed)
Center for [facility] 03/16/2020  CC: regular post op visit  Subjective:     Alita Waldren is a 39 y.o. s/p 5/28 laparoscopic RSO for ruputured ectopic. Pt was discharged to home the next day due to transportation. She went to the ED twice since then for umbilical/abodminal pain but CT, work up negative  Pt doing well today and occasional discomfort near the umbilicus.   Review of Systems Pertinent items noted in HPI and remainder of comprehensive ROS otherwise negative.    Objective:    BP 119/89   Pulse 64   Ht 5\' 4"  (1.626 m)   Wt 186 lb 6.4 oz (84.6 kg)   LMP 12/24/2019 (Exact Date)   Breastfeeding Unknown   BMI 32.00 kg/m  General:  alert  Abdomen: soft, bowel sounds active, non-tender  Incision:   c/d/i l/s incision sites. Well healed, nttp, no e/o infection     Assessment:    Doing well postoperatively. Operative findings again reviewed. Pathology report discussed.    Plan:   Desires pregnancy. D/w her that left tube didn't look completely normal and with her history she is at high risk for repeat ectopic. I told her I recommend waiting until next period and then trying again and once she has +UPT to call us to follow her closely with betas and u/s. Pt amenable to plan.  [name] MD Attending Center for [employer])

## 2020-08-24 ENCOUNTER — Other Ambulatory Visit: Payer: Self-pay | Admitting: Internal Medicine

## 2020-08-24 DIAGNOSIS — E041 Nontoxic single thyroid nodule: Secondary | ICD-10-CM

## 2020-09-06 ENCOUNTER — Ambulatory Visit
Admission: RE | Admit: 2020-09-06 | Discharge: 2020-09-06 | Disposition: A | Discharge status: Home / Self Care | Payer: Medicaid Other | Source: Ambulatory Visit | Attending: Internal Medicine | Admitting: Internal Medicine

## 2020-09-06 DIAGNOSIS — E041 Nontoxic single thyroid nodule: Secondary | ICD-10-CM

## 2020-10-05 DIAGNOSIS — E282 Polycystic ovarian syndrome: Secondary | ICD-10-CM | POA: Insufficient documentation

## 2020-10-05 DIAGNOSIS — E042 Nontoxic multinodular goiter: Secondary | ICD-10-CM

## 2020-10-05 HISTORY — DX: Nontoxic multinodular goiter: E04.2

## 2020-11-16 ENCOUNTER — Encounter (INDEPENDENT_AMBULATORY_CARE_PROVIDER_SITE_OTHER): Payer: Self-pay

## 2020-11-26 ENCOUNTER — Encounter (INDEPENDENT_AMBULATORY_CARE_PROVIDER_SITE_OTHER): Payer: Self-pay

## 2021-06-10 ENCOUNTER — Other Ambulatory Visit: Payer: Self-pay | Admitting: Internal Medicine

## 2021-06-10 DIAGNOSIS — E042 Nontoxic multinodular goiter: Secondary | ICD-10-CM

## 2021-06-18 ENCOUNTER — Other Ambulatory Visit: Payer: Self-pay

## 2021-06-18 ENCOUNTER — Encounter (INDEPENDENT_AMBULATORY_CARE_PROVIDER_SITE_OTHER): Payer: Self-pay | Admitting: Family Medicine

## 2021-06-18 ENCOUNTER — Ambulatory Visit (INDEPENDENT_AMBULATORY_CARE_PROVIDER_SITE_OTHER): Payer: Medicaid Other | Admitting: Family Medicine

## 2021-06-18 VITALS — BP 90/49 | HR 54 | Temp 98.0°F | Ht 64.0 in | Wt 192.0 lb

## 2021-06-18 DIAGNOSIS — Z8759 Personal history of other complications of pregnancy, childbirth and the puerperium: Secondary | ICD-10-CM

## 2021-06-18 DIAGNOSIS — R5383 Other fatigue: Secondary | ICD-10-CM | POA: Diagnosis not present

## 2021-06-18 DIAGNOSIS — Z6833 Body mass index (BMI) 33.0-33.9, adult: Secondary | ICD-10-CM

## 2021-06-18 DIAGNOSIS — E042 Nontoxic multinodular goiter: Secondary | ICD-10-CM

## 2021-06-18 DIAGNOSIS — E669 Obesity, unspecified: Secondary | ICD-10-CM

## 2021-06-18 DIAGNOSIS — G4709 Other insomnia: Secondary | ICD-10-CM

## 2021-06-18 DIAGNOSIS — E282 Polycystic ovarian syndrome: Secondary | ICD-10-CM

## 2021-06-18 DIAGNOSIS — R0602 Shortness of breath: Secondary | ICD-10-CM | POA: Diagnosis not present

## 2021-06-18 DIAGNOSIS — Z1331 Encounter for screening for depression: Secondary | ICD-10-CM | POA: Diagnosis not present

## 2021-06-18 DIAGNOSIS — L409 Psoriasis, unspecified: Secondary | ICD-10-CM

## 2021-06-18 DIAGNOSIS — Z9884 Bariatric surgery status: Secondary | ICD-10-CM

## 2021-06-18 DIAGNOSIS — F419 Anxiety disorder, unspecified: Secondary | ICD-10-CM

## 2021-06-18 DIAGNOSIS — N809 Endometriosis, unspecified: Secondary | ICD-10-CM

## 2021-06-18 DIAGNOSIS — K760 Fatty (change of) liver, not elsewhere classified: Secondary | ICD-10-CM

## 2021-06-18 NOTE — Progress Notes (Signed)
Dear Dr. Maia Ramos,   Thank you for referring Stacey Ramos to our clinic. The following note includes my evaluation and treatment recommendations.  Chief Complaint:   OBESITY Stacey Ramos (MR# [id number]) is a 40 y.o. female who presents for evaluation and treatment of obesity and related comorbidities. Current BMI is Body mass index is 32.96 kg/m. Stacey Ramos has been struggling with her weight for many years and has been unsuccessful in either losing weight, maintaining weight loss, or reaching her healthy weight goal.  Stacey Ramos is currently in the action stage of change and ready to dedicate time achieving and maintaining a healthier weight. Stacey Ramos is interested in becoming our patient and working on intensive lifestyle modifications including (but not limited to) diet and exercise for weight loss.  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  She is single and lives with a roommate and her daughter.  She has history of a heart murmur.  She is currently in therapy.  Craves sweets.  Stacey Ramos's habits were reviewed today and are as follows: Her family eats meals together, she thinks her family will eat healthier with her, she struggles with family and or coworkers weight loss sabotage, her desired weight loss is 35 pounds, she has been heavy most of her life, she started gaining weight after pregnancy and a few rounds of steroids, her heaviest weight ever was 374 pounds, she is a picky eater and doesn't like to eat healthier foods, she craves sweets and breads, she snacks frequently in the evenings, she is frequently drinking liquids with calories, she frequently makes poor food choices, she has problems with excessive hunger, she frequently eats larger portions than normal, and she struggles with emotional eating.  Depression Screen Stacey Ramos's Food and Mood (modified PHQ-9) score was 10.  Depression screen PHQ 2/9 06/18/2021  Decreased Interest 1  Down, Depressed,  Hopeless 1  PHQ - 2 Score 2  Altered sleeping 2  Tired, decreased energy 1  Change in appetite 2  Feeling bad or failure about yourself  1  Trouble concentrating 2  Moving slowly or fidgety/restless 0  Suicidal thoughts 0  PHQ-9 Score 10  Difficult doing work/chores Not difficult at all   Assessment/Plan:   1. Other fatigue Stacey Ramos denies daytime somnolence and reports waking up still tired. Patent has a history of symptoms of morning fatigue. Stacey Ramos generally gets 5 or 6 hours of sleep per night, and states that she has generally restful sleep. Snoring is not present. Apneic episodes are not present. Epworth Sleepiness Score is 3.  Stacey Ramos does feel that her weight is causing her energy to be lower than it should be. Fatigue may be related to obesity, depression or many other causes. Labs will be ordered, and in the meanwhile, Stacey Ramos will focus on self care including making healthy food choices, increasing physical activity and focusing on stress reduction.  - EKG 12-Lead  2. SOB (shortness of breath) on exertion Stacey Ramos notes increasing shortness of breath with exercising and seems to be worsening over time with weight gain. She notes getting out of breath sooner with activity than she used to. This has not gotten worse recently. Stacey Ramos denies shortness of breath at rest or orthopnea.  3. PCOS (polycystic ovarian syndrome) She will continue to focus on protein-rich, low simple carbohydrate foods. We reviewed the importance of hydration, regular exercise for stress reduction, and restorative sleep.   Counseling PCOS is a leading cause of menstrual irregularities and infertility. It  is also associated with obesity, hirsutism (excessive hair growth on the face, chest, or back), and cardiovascular risk factors such as high cholesterol and insulin resistance. Insulin resistance appears to play a central role.  Women with PCOS have been shown to have impaired appetite-regulating  hormones. Women with polycystic ovary syndrome (PCOS) have an increased risk for cardiovascular disease (CVD) - European Journal of Preventive Cardiology.  - Insulin, random  4. Multinodular goiter Stacey Ramos is followed by Stacey Ramos in Endocrinology. We will continue to monitor symptoms as they relate to her weight loss journey.  5. Other insomnia This is poorly controlled. Dysfunction: frequent night time awakening and difficulty falling asleep. Average hours of sleep per night: 6. Current treatment: None.  Plan: Recommend sleep hygiene measures including regular sleep schedule, optimal sleep environment, and relaxing presleep rituals.   6. Fatty liver NAFLD is an umbrella term that encompasses a disease spectrum that includes steatosis (fat) without inflammation, steatohepatitis (NASH; fat + inflammation in a characteristic pattern), and cirrhosis. Bland steatosis is felt to be a benign condition, with extremely low to no risk of progression to cirrhosis, whereas NASH can progress to cirrhosis. The mainstay of treatment of NAFLD includes lifestyle modification to achieve weight loss, at least 7% of current body weight. Low carbohydrate diets can be beneficial in improving NAFLD liver histology. Additionally, exercise, even the absence of weight loss can have beneficial effects on the patient's metabolic profile and liver health.   7. Endometriosis Referral to GYN.  - Ambulatory referral to Gynecology  8. Psoriasis Improved significantly after bariatric surgery.  Increased with sugar/carb intake.  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  Highest weight was 374 pounds, lowest 172 pounds.  She has history of dehydration and seizure when drinking too much water/exercising too much.  Counseling You may need to eat 3 meals and 2 snacks, or 5 small meals each day in order to reach your protein and calorie goals.  Allow at least 15 minutes for each meal so that you can eat  mindfully. Listen to your body so that you do not overeat. For most people, your sleeve or pouch will comfortably hold 4-6 ounces. Eat foods from all food groups. This includes fruits and vegetables, grains, dairy, and meat and other proteins. Include a protein-rich food at every meal and snack, and eat the protein food first.  You should be taking a Bariatric Multivitamin as well as calcium.   10. History of ectopic pregnancy In 2021.  Referral to GYN is being placed today for endometriosis.  67. Depression screening Stacey Ramos was screened for depression as part of her new patient workup today.  PHQ-9 is 10.  Stacey Ramos had a positive depression screening. Depression is commonly associated with obesity and often results in emotional eating behaviors. We will monitor this closely and work on CBT to help improve the non-hunger eating patterns. Referral to Psychology may be required if no improvement is seen as she continues in our clinic.  12. Anxiety, with emotional eating Uncontrolled.  Medication: None.  She endorses overeating.  She tends to eat when stressed, when sad, as a reward, when bored, when feeling guilty, when angry, and to help comfort herself.  Plan:  Discussed cues and consequences, how thoughts affect eating, model of thoughts, feelings, and behaviors, and strategies for change by focusing on the cue. Discussed cognitive distortions, coping thoughts, and how to change your thoughts.  13. Class 1 obesity with serious comorbidity and body mass index (  BMI) of 33.0 to 33.9 in adult, unspecified obesity type  Stacey Ramos is currently in the action stage of change and her goal is to continue with weight loss efforts. I recommend Zahari begin the structured treatment plan as follows:  She has agreed to keeping a food journal and adhering to recommended goals of 1000 calories and 85 grams of protein.  Exercise goals: No exercise has been prescribed at this time.   Behavioral modification  strategies: increasing lean protein intake, decreasing simple carbohydrates, increasing vegetables, increasing water intake, and decreasing liquid calories.  She was informed of the importance of frequent follow-up visits to maximize her success with intensive lifestyle modifications for her multiple health conditions. She was informed we would discuss her lab results at her next visit unless there is a critical issue that needs to be addressed sooner. Stacey Ramos agreed to keep her next visit at the agreed upon time to discuss these results.  Objective:   Blood pressure (!) 90/49, pulse (!) 54, temperature 98 F (36.7 C), temperature source Oral, height 5\' 4"  (1.626 m), weight 192 lb (87.1 kg), last menstrual period 06/13/2021, SpO2 98 %, unknown if currently breastfeeding. Body mass index is 32.96 kg/m.  EKG: Normal sinus rhythm, rate 46 bpm.  Indirect Calorimeter completed today shows a VO2 of 205 and a REE of 1411.  Her calculated basal metabolic rate is 8453 thus her basal metabolic rate is worse than expected.  General: Cooperative, alert, well developed, in no acute distress. HEENT: Conjunctivae and lids unremarkable. Cardiovascular: Regular rhythm.  Lungs: Normal work of breathing. Neurologic: No focal deficits.   Lab Results  Component Value Date   CREATININE 0.60 02/22/2020   BUN 15 02/22/2020   NA 140 02/22/2020   K 4.0 02/22/2020   CL 103 02/22/2020   CO2 27 02/22/2020   Lab Results  Component Value Date   ALT 13 02/22/2020   AST 25 02/22/2020   ALKPHOS 41 02/22/2020   BILITOT 0.6 02/22/2020   Lab Results  Component Value Date   HGBA1C 4.9 09/16/2019   Lab Results  Component Value Date   TSH 1.457 09/16/2019   Lab Results  Component Value Date   WBC 6.5 02/22/2020   HGB 13.6 02/22/2020   HCT 41.1 02/22/2020   MCV 95.4 02/22/2020   PLT 262 02/22/2020   Attestation Statements:   This is the patient's first visit at [facility]. The patient's  NEW PATIENT PACKET was reviewed at length. Included in the packet: current and past health history, medications, allergies, ROS, gynecologic history (women only), surgical history, family history, social history, weight history, weight loss surgery history (for those that have had weight loss surgery), nutritional evaluation, mood and food questionnaire, PHQ9, Epworth questionnaire, sleep habits questionnaire, patient life and health improvement goals questionnaire. These will all be scanned into the patient's chart under media.   During the visit, I independently reviewed the patient's EKG, bioimpedance scale results, and indirect calorimeter results. I used this information to tailor a meal plan for the patient that will help her to lose weight and will improve her obesity-related conditions going forward. I performed a medically necessary appropriate examination and/or evaluation. I discussed the assessment and treatment plan with the patient. The patient was provided an opportunity to ask questions and all were answered. The patient agreed with the plan and demonstrated an understanding of the instructions. Labs were ordered at this visit and will be reviewed at the next visit unless more critical results  need to be addressed immediately. Clinical information was updated and documented in the EMR.   I, Water quality scientist, CMA, am acting as transcriptionist for Briscoe Deutscher, DO  I have reviewed the above documentation for accuracy and completeness, and I agree with the above. Briscoe Deutscher, DO

## 2021-06-19 LAB — INSULIN, RANDOM: INSULIN: 8.6 u[IU]/mL (ref 2.6–24.9)

## 2021-06-20 ENCOUNTER — Other Ambulatory Visit: Payer: Self-pay | Admitting: Internal Medicine

## 2021-06-20 DIAGNOSIS — Z1231 Encounter for screening mammogram for malignant neoplasm of breast: Secondary | ICD-10-CM

## 2021-07-01 ENCOUNTER — Other Ambulatory Visit: Payer: Medicaid Other

## 2021-07-04 ENCOUNTER — Other Ambulatory Visit: Payer: Self-pay

## 2021-07-04 ENCOUNTER — Ambulatory Visit: Payer: Medicaid Other | Admitting: Obstetrics & Gynecology

## 2021-07-04 ENCOUNTER — Other Ambulatory Visit (HOSPITAL_COMMUNITY)
Admission: RE | Admit: 2021-07-04 | Discharge: 2021-07-04 | Disposition: A | Discharge status: Home / Self Care | Payer: Medicaid Other | Source: Ambulatory Visit | Attending: Obstetrics & Gynecology | Admitting: Obstetrics & Gynecology

## 2021-07-04 ENCOUNTER — Encounter: Payer: Self-pay | Admitting: Obstetrics & Gynecology

## 2021-07-04 VITALS — BP 105/68 | HR 62 | Wt 194.0 lb

## 2021-07-04 DIAGNOSIS — N946 Dysmenorrhea, unspecified: Secondary | ICD-10-CM

## 2021-07-04 DIAGNOSIS — N809 Endometriosis, unspecified: Secondary | ICD-10-CM | POA: Insufficient documentation

## 2021-07-04 DIAGNOSIS — Z1231 Encounter for screening mammogram for malignant neoplasm of breast: Secondary | ICD-10-CM

## 2021-07-04 DIAGNOSIS — Z124 Encounter for screening for malignant neoplasm of cervix: Secondary | ICD-10-CM

## 2021-07-04 DIAGNOSIS — N939 Abnormal uterine and vaginal bleeding, unspecified: Secondary | ICD-10-CM | POA: Insufficient documentation

## 2021-07-04 NOTE — Progress Notes (Signed)
GYN patient presents for problem visit today. Pt wants to discuss PCOS, Endometriosis and wants a PAP.  Pt notes having Ablation  done 2019  Last pap: 3 yrs ago in [place]  LMP: 06/30/21  Periods irregular and very painful. Cycles last 5-10 days w/ both moderate and heavy flow  Pt is considering Hysterectomy.   Notes having issues w/ Thyroid as well.

## 2021-07-04 NOTE — Patient Instructions (Signed)
Research Lysteda for heavy periods - nonhormonal  Endometrial Biopsy An endometrial biopsy is a procedure to remove tissue samples from the endometrium, which is the lining of the uterus. The tissue that is removed can then be checked under a microscope for disease. This procedure is used to diagnose conditions such as endometrial cancer, endometrial tuberculosis, polyps, or other inflammatory conditions. This procedure may also be used to investigate uterine bleeding to determine where you are in your menstrual cycle or how your hormone levels are affecting the lining of the uterus. Tell a health care provider about: Any allergies you have. All medicines you are taking, including vitamins, herbs, eye drops, creams, and over-the-counter medicines. Any problems you or family members have had with anesthetic medicines. Any blood disorders you have. Any surgeries you have had. Any medical conditions you have. Whether you are pregnant or may be pregnant. What are the risks? Generally, this is a safe procedure. However, problems may occur, including: Bleeding. Pelvic infection. Puncture of the wall of the uterus with the biopsy device (rare). Allergic reactions to medicines. What happens before the procedure? Keep a record of your menstrual cycles as told by your health care provider. You may need to schedule your procedure for a specific time in your cycle. You may want to bring a sanitary pad to wear after the procedure. Plan to have someone take you home from the hospital or clinic. Ask your health care provider about: Changing or stopping your regular medicines. This is especially important if you are taking diabetes medicines, arthritis medicines, or blood thinners. Taking medicines such as aspirin and ibuprofen. These medicines can thin your blood. Do not take these medicines unless your health care provider tells you to take them. Taking over-the-counter medicines, vitamins, herbs, and  supplements. What happens during the procedure? You will lie on an exam table with your feet and legs supported as in a pelvic exam. Your health care provider will insert an instrument (speculum) into your vagina to see your cervix. Your cervix will be cleansed with an antiseptic solution. A medicine (local anesthetic) will be used to numb the cervix. A forceps instrument (tenaculum) will be used to hold your cervix steady for the biopsy. A thin, rod-like instrument (uterine sound) will be inserted through your cervix to determine the length of your uterus and the location where the biopsy sample will be removed. A thin, flexible tube (catheter) will be inserted through your cervix and into the uterus. The catheter will be used to collect the biopsy sample from your endometrial tissue. The catheter and speculum will then be removed, and the tissue sample will be sent to a lab for examination. The procedure may vary among health care providers and hospitals. What can I expect after procedure? You will rest in a recovery area until you are ready to go home. You may have mild cramping and a small amount of vaginal bleeding. This is normal. You may have a small amount of vaginal bleeding for a few days. This is normal. It is up to you to get the results of your procedure. Ask your health care provider, or the department that is doing the procedure, when your results will be ready. Follow these instructions at home: Take over-the-counter and prescription medicines only as told by your health care provider. Do not douche, use tampons, or have sexual intercourse until your health care provider approves. Return to your normal activities as told by your health care provider. Ask your health care provider  what activities are safe for you. Follow instructions from your health care provider about any activity restrictions, such as restrictions on strenuous exercise or heavy lifting. Keep all follow-up  visits. This is important. Contact a health care provider: You have heavy bleeding, or bleed for longer than 2 days after the procedure. You have bad smelling discharge from your vagina. You have a fever or chills. You have a burning sensation when urinating or you have difficulty urinating. You have severe pain in your lower abdomen. Get help right away if you: You have severe cramps in your stomach or back. You pass large blood clots. Your bleeding increases. You become weak or light-headed, or you faint or lose consciousness. Summary An endometrial biopsy is a procedure to remove tissue samples is taken from the endometrium, which is the lining of the uterus. The tissue sample that is removed will be checked under a microscope for disease. This procedure is used to diagnose conditions such as endometrial cancer, endometrial tuberculosis, polyps, or other inflammatory conditions. After the procedure, it is common to have mild cramping and a small amount of vaginal bleeding for a few days. Do not douche, use tampons, or have sexual intercourse until your health care provider approves. Ask your health care provider which activities are safe for you. This information is not intended to replace advice given to you by your health care provider. Make sure you discuss any questions you have with your health care provider. Document Revised: 04/02/2020 Document Reviewed: 04/02/2020 Elsevier Patient Education  2022 [place].   Hysterectomy Information A hysterectomy is a surgery in which the uterus is removed. The lowest part of the uterus (cervix), which opens into the vagina, may be removed as well. In some cases, the fallopian tubes, the ovaries,  or both the fallopian tubes and the ovaries may also be removed. This procedure may be done to treat different medical problems. It may also be done to help transgender men feel more masculine. After the procedure, a woman will no longer have  menstrual periods and will not be able to become pregnant (sterile). What are the reasons for a hysterectomy? There are many reasons why a person might have this procedure. They include: Persistent, abnormal vaginal bleeding. Long-term (chronic) pelvic pain or infection. Endometriosis. This is when the lining of the uterus (endometrium) starts to grow outside the uterus. Adenomyosis. This is when the endometrium starts to grow in the muscle of the uterus. Pelvic organ prolapse. This is a condition in which the uterus falls down into the vagina. Noncancerous growths in the uterus (uterine fibroids) that cause symptoms. The presence of precancerous cells. Cervical or uterine cancer. Sex change. This helps a transgender man complete his female identity. What are the different types of hysterectomy? There are three different types of hysterectomy: Supracervical hysterectomy. In this type, the top part of the uterus is removed, but not the cervix. Total hysterectomy. In this type, the uterus and cervix are removed. Radical hysterectomy. In this type, the uterus, the cervix, and the tissue that holds the uterus in place (parametrium) are removed. What are the different ways a hysterectomy can be performed? There are many different ways a hysterectomy can be performed, including: Abdominal hysterectomy. In this type, an incision is made in the abdomen. The uterus is removed through this incision. Vaginal hysterectomy. In this type, an incision is made in the vagina. The uterus is removed through this incision. There are no abdominal incisions. Conventional laparoscopic hysterectomy. In this  type, 3 or 4 small incisions are made in the abdomen. A thin, lighted tube with a camera (laparoscope) is inserted into one of the incisions. Other tools are put through the other incisions. The uterus is cut into small pieces. The small pieces are removed through the incisions or the vagina. Laparoscopically  assisted vaginal hysterectomy (LAVH). In this type, 3 or 4 small incisions are made in the abdomen. Part of the surgery is performed laparoscopically and the other part is done vaginally. The uterus is removed through the vagina. Robot-assisted laparoscopic hysterectomy. In this type, a laparoscope and other tools are inserted into 3 or 4 small incisions in the abdomen. A computer-controlled device is used to give the surgeon a 3D image and to help control the surgical instruments. This allows for more precise movements of surgical instruments. The uterus is cut into small pieces and removed through the incisions or the vagina. Discuss the options with your health care provider to determine which type is the right one for you. What are the risks of this surgery? Generally, this is a safe procedure. However, problems may occur, including: Bleeding and risk of blood transfusion. Tell your health care provider if you do not want to receive any blood products. Blood clots in the legs or lung. Infection. Damage to nearby structures or organs. Allergic reactions to medicines. Having to change to an abdominal hysterectomy after starting a less invasive technique. What to expect after a hysterectomy You will be given pain medicine. You may need to stay in the hospital for 1-2 days to recover, depending on the type of hysterectomy you had. You will need to have someone with you for the first 3-5 days after you go home. You will need to follow up with your surgeon in 2-4 weeks after surgery to evaluate your progress. If the ovaries are removed, you will have early menopause symptoms such as hot flashes, night sweats, and insomnia. If you had a hysterectomy for a problem that was not cancer or a condition that could not lead to cancer, then you no longer need Pap tests. However, even if you no longer need a Pap test, get regular pelvic exams to make sure no other problems are developing. Questions to ask your  health care provider Is a hysterectomy medically necessary? Do I have other treatment options for my condition? What are my options for hysterectomy procedure? What organs and tissues need to be removed? What are the risks? What are the benefits? How long will I need to stay in the hospital after the procedure? How long will I need to recover at home? What symptoms can I expect after the procedure? Summary A hysterectomy is a surgery in which the uterus is removed. The fallopian tubes, the ovaries, or both may be removed as well. This procedure may be done to treat different medical problems. It may also be done to complete your female identity during a sex change. After the procedure, a woman will no longer have a menstrual period and will not be able to become pregnant. There are three types of hysterectomy. Discuss with your health care provider which options are right for you. This is a safe procedure, though there are some risks. Risks include infection, bleeding, blood clots, or damage to nearby organs. This information is not intended to replace advice given to you by your health care provider. Make sure you discuss any questions you have with your health care provider. Document Revised: 07/08/2019 Document Reviewed: 07/08/2019 Elsevier  Elsevier Patient Education  2021 Elsevier Inc[employer]   

## 2021-07-04 NOTE — Progress Notes (Signed)
GYNECOLOGY OFFICE VISIT NOTE  History:   Stacey Ramos is a 40 y.o. [id number] with history of endometriosis and abnormal uterine bleeding  s/p endometrial ablation in 2019 here today for evaluation of continued irregular and very painful bleeding. Periods last 5-10 days with both moderate and heavy flow, and debilitating pain. Has intermenstrual spotting.  Has used OCPs and Mirena IUD many years ago, this did not help her endometriosis symptoms. She underwent removal of her right ovary and fallopian tube on 02/17/2020 because she had a right tubal ectopic pregnancy, also had to undergo extensive lysis of adhesions due to her endometriosis. 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  Patient desires definitive management with hysterectomy.  She denies any abnormal vaginal discharge or other concerns.    Past Medical History:  Diagnosis Date   Anemia    Anxiety    Asthma    Back pain    Depression    Dysmenorrhea    Ectopic pregnancy 02/17/2020   Endometriosis    Fatty liver    GERD (gastroesophageal reflux disease)    Infertility, female    Iron deficiency anemia    Lower extremity edema    Over weight    Polycystic ovarian syndrome    Psoriasis    Sleep apnea    SOB (shortness of breath)    Syncope due to orthostatic hypotension 09/17/2019   Vitamin D deficiency     Past Surgical History:  Procedure Laterality Date   ABLATION ON ENDOMETRIOSIS  2019   APPENDECTOMY     as a child. 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   LAPAROSCOPIC LYSIS OF ADHESIONS  02/17/2020   Procedure: Laparoscopic Lysis Of Adhesions;  Surgeon: Aletha Halim, MD;  Location: [facility];  Service: Gynecology;;   LAPAROSCOPIC UNILATERAL SALPINGO OOPHERECTOMY Right 02/17/2020   Procedure: LAPAROSCOPIC RIGHT SALPINGO OOPHORECTOMY WITH REMOVAL OF ECTOPIC PREGNANCY;  Surgeon: Aletha Halim, MD;  Location: [facility];  Service: Gynecology;  Laterality: Right;   TONSILLECTOMY      WISDOM TOOTH EXTRACTION      The following portions of the patient's history were reviewed and updated as appropriate: allergies, current medications, past family history, past medical history, past social history, past surgical history and problem list.   Health Maintenance:  Normal pap many years ago.   Review of Systems:  Pertinent items noted in HPI and remainder of comprehensive ROS otherwise negative.  Physical Exam:  BP 105/68   Pulse 62   Wt 194 lb (88 kg)   LMP 06/30/2021 (Exact Date) Comment: irregular  BMI 33.30 kg/m  CONSTITUTIONAL: Well-developed, well-nourished female in no acute distress.  HEENT:  Normocephalic, atraumatic. External right and left ear normal. No scleral icterus.  NECK: Normal range of motion, supple, no masses noted on observation SKIN: No rash noted. Not diaphoretic. No erythema. No pallor. MUSCULOSKELETAL: Normal range of motion. No edema noted. NEUROLOGIC: Alert and oriented to person, place, and time. Normal muscle tone coordination. No cranial nerve deficit noted. PSYCHIATRIC: Normal mood and affect. Normal behavior. Normal judgment and thought content. CARDIOVASCULAR: Normal heart rate noted RESPIRATORY: Effort and breath sounds normal, no problems with respiration noted ABDOMEN: Moderate diffuse lower abdominal tenderness, no rebound or guarding. No masses noted. No other overt distention noted.   PELVIC: Normal appearing external genitalia; normal urethral meatus; normal appearing vaginal mucosa and cervix.  No abnormal discharge noted. Pap smear noted, this caused significant bleeding around transformation zone -  ameliorated with silver nitrate. Normal uterine size and mobile on bimanual exam, no other palpable masses. Significant uterine and left adnexal tenderness during exam. Performed in the presence of a chaperone     Assessment and Plan:     1. Abnormal uterine bleeding (AUB) 2. Dysmenorrhea 3. Endometriosis Patient desires  hysterectomy. Concerned about adhesive disease noted on previous surgery in the setting of her endometriosis.  Already had endometrial ablation. Not willing to have hormonal management now; has tried Mirena IUD and hormones in the past and this did not help her symptoms, and actually exacerbated her symptoms. Declines repeat trial of progestin therapy, worried about weight gain side effect also.  Will obtain pelvic ultrasound to further evaluate for any uterine pathology, endometrial biopsy also recommended given her AUB.  She will return for the endometrial biopsy, premedication with pain medication recommended.  Will decide on modality of hysterectomy at next visit, discuss surgery in more detail. Likely will need laparoscopic or robotic assisted hysterectomy in lieu of open surgery, given extent of her adhesive disease. Will consult with my partners with more expertise in minimally invasive surgery. - Cytology - PAP - US PELVIC COMPLETE WITH TRANSVAGINAL; Future  4. Pap smear for cervical cancer screening - Cytology - PAP done, will follow up results and manage accordingly.  5. Breast cancer screening by mammogram - MM 3D SCREEN BREAST BILATERAL; Future scheduled  Routine preventative health maintenance measures emphasized. Please refer to After Visit Summary for other counseling recommendations.   Return in about 2 weeks (around 07/18/2021) for Endometrial biopsy and discuss ultrasound results, hysterectomy planning.    I spent 30 minutes dedicated to the care of this patient including pre-visit review of records, face to face time with the patient discussing her conditions and treatments and post visit orders.    Verita Schneiders, MD, [place] for [employer], [place]

## 2021-07-09 ENCOUNTER — Other Ambulatory Visit: Payer: Self-pay

## 2021-07-09 ENCOUNTER — Encounter (INDEPENDENT_AMBULATORY_CARE_PROVIDER_SITE_OTHER): Payer: Self-pay | Admitting: Family Medicine

## 2021-07-09 ENCOUNTER — Ambulatory Visit (INDEPENDENT_AMBULATORY_CARE_PROVIDER_SITE_OTHER): Payer: Medicaid Other | Admitting: Family Medicine

## 2021-07-09 ENCOUNTER — Encounter: Payer: Self-pay | Admitting: Obstetrics & Gynecology

## 2021-07-09 VITALS — BP 93/63 | HR 74 | Temp 97.9°F | Ht 64.0 in | Wt 188.0 lb

## 2021-07-09 DIAGNOSIS — F5081 Binge eating disorder: Secondary | ICD-10-CM | POA: Diagnosis not present

## 2021-07-09 DIAGNOSIS — E669 Obesity, unspecified: Secondary | ICD-10-CM

## 2021-07-09 DIAGNOSIS — Z6833 Body mass index (BMI) 33.0-33.9, adult: Secondary | ICD-10-CM

## 2021-07-09 DIAGNOSIS — F439 Reaction to severe stress, unspecified: Secondary | ICD-10-CM

## 2021-07-09 DIAGNOSIS — N946 Dysmenorrhea, unspecified: Secondary | ICD-10-CM | POA: Diagnosis not present

## 2021-07-09 DIAGNOSIS — R87810 Cervical high risk human papillomavirus (HPV) DNA test positive: Secondary | ICD-10-CM | POA: Insufficient documentation

## 2021-07-09 DIAGNOSIS — Z9884 Bariatric surgery status: Secondary | ICD-10-CM | POA: Diagnosis not present

## 2021-07-09 DIAGNOSIS — R87610 Atypical squamous cells of undetermined significance on cytologic smear of cervix (ASC-US): Secondary | ICD-10-CM | POA: Insufficient documentation

## 2021-07-09 LAB — CYTOLOGY - PAP
Chlamydia: NEGATIVE
Comment: NEGATIVE
Comment: NEGATIVE
Comment: NEGATIVE
Comment: NEGATIVE
Comment: NORMAL
Diagnosis: UNDETERMINED — AB
HPV 16: NEGATIVE
HPV 18 / 45: NEGATIVE
High risk HPV: POSITIVE — AB
Neisseria Gonorrhea: NEGATIVE
Trichomonas: NEGATIVE

## 2021-07-09 MED ORDER — LISDEXAMFETAMINE DIMESYLATE 10 MG PO CAPS
10.0000 mg | ORAL_CAPSULE | Freq: Every morning | ORAL | 0 refills | Status: DC
Start: 2021-07-09 — End: 2021-07-29

## 2021-07-10 NOTE — Progress Notes (Signed)
Chief Complaint:   OBESITY Stacey Ramos is here to discuss her progress with her obesity treatment plan along with follow-up of her obesity related diagnoses. See Medical Weight Management Flowsheet for complete bioelectrical impedance results.  Today's visit was #: 2 Starting weight: 192 lbs Starting date: 06/18/2021 Weight change since last visit: 4 lbs Total lbs lost to date: 4 lbs Total weight loss percentage to date: -2.08%  Nutrition Plan: Keeping a food journal and adhering to recommended goals of 1000 calories and 85 grams of protein daily for 50% of the time. Activity: Increased walking.  Interim History:  1) Stacey Ramos saw her GYN - I reviewed the GYN note.   2) Her sister is out of jail. 3) Relationship - lives with ex-boyfriend. 4) Thyroid - was supposed to have further imaging, but insurance denied the order. 5) Job - not enough hours - considering another job. 6) Insomnia - poor sleep habits lately. 7) Suspects ADHD - daughter has it.  Reviewed Bar[employer] App - Averaging 1000 calories and 100 grams of protein per day with binge eating.  Assessment/Plan:   1. Dysmenorrhea Reviewed note with GYN. We will continue to monitor symptoms as they relate to her weight loss journey.  2. History of Roux-en-Y gastric bypass Stacey Ramos is at risk for malnutrition due to her previous bariatric surgery.   Counseling You may need to eat 3 meals and 2 snacks, or 5 small meals each day in order to reach your protein and calorie goals.  Allow at least 15 minutes for each meal so that you can eat mindfully. Listen to your body so that you do not overeat. For most people, your sleeve or pouch will comfortably hold 4-6 ounces. Eat foods from all food groups. This includes fruits and vegetables, grains, dairy, and meat and other proteins. Include a protein-rich food at every meal and snack, and eat the protein food first.  You should be taking a Bariatric Multivitamin as well as calcium.    3. Situational stress Stacey Ramos will see her therapist tomorrow. Motivational interviewing as well as evidence-based interventions for health behavior change were utilized today including the discussion of self monitoring techniques, problem-solving barriers and SMART goal setting techniques.  Discussed cues and consequences, how thoughts affect eating, model of thoughts, feelings, and behaviors, and strategies for change by focusing on the cue. Discussed cognitive distortions, coping thoughts, and how to change your thoughts.  4. Binge eating disorder Stacey Ramos meets binge eating disorder (BED) requirements, including: eating until feeling uncomfortably full and depressed or guilty about binge eating.   Plan:  Start Vyvanse 10 mg daily for BED.  People who binge eat feel as if they don't have control over how much they eat and have feelings of guilt or self-loathing after a binge eating episode. The FDA has approved Vyvanse as a treatment option for both ADHD and binge eating. Vyvanse targets the brain's reward center by increasing the levels of dopamine and norepinephrine, the chemicals of the brain responsible for feelings of pleasure. Mindful eating is the recommended nutritional approach to treating BED.   - Start lisdexamfetamine (VYVANSE) 10 MG capsule; Take 1 capsule (10 mg total) by mouth every morning.  Dispense: 30 capsule; Refill: 0  I have consulted the [place] Controlled Substances Registry for this patient, and feel the risk/benefit ratio today is favorable for proceeding with this prescription for a controlled substance. The patient understands monitoring parameters and red flags.   5. Obesity, current BMI 32.4  Course:  Stacey Ramos is currently in the action stage of change. As such, her goal is to continue with weight loss efforts.   Nutrition goals: She has agreed to keeping a food journal and adhering to recommended goals of 1000 calories and 85 grams of protein.   Exercise goals:  As  is.  Behavioral modification strategies: emotional eating strategies.  Stacey Ramos has agreed to follow-up with our clinic in 3-4 weeks. She was informed of the importance of frequent follow-up visits to maximize her success with intensive lifestyle modifications for her multiple health conditions.   Objective:   Blood pressure 93/63, pulse 74, temperature 97.9 F (36.6 C), temperature source Oral, height 5\' 4"  (1.626 m), weight 188 lb (85.3 kg), last menstrual period 06/30/2021, SpO2 100 %, unknown if currently breastfeeding. Body mass index is 32.27 kg/m.  General: Cooperative, alert, well developed, in no acute distress. HEENT: Conjunctivae and lids unremarkable. Cardiovascular: Regular rhythm.  Lungs: Normal work of breathing. Neurologic: No focal deficits.   Lab Results  Component Value Date   CREATININE 0.60 02/22/2020   BUN 15 02/22/2020   NA 140 02/22/2020   K 4.0 02/22/2020   CL 103 02/22/2020   CO2 27 02/22/2020   Lab Results  Component Value Date   ALT 13 02/22/2020   AST 25 02/22/2020   ALKPHOS 41 02/22/2020   BILITOT 0.6 02/22/2020   Lab Results  Component Value Date   HGBA1C 4.9 09/16/2019   Lab Results  Component Value Date   INSULIN 8.6 06/18/2021   Lab Results  Component Value Date   TSH 1.457 09/16/2019   Lab Results  Component Value Date   WBC 6.5 02/22/2020   HGB 13.6 02/22/2020   HCT 41.1 02/22/2020   MCV 95.4 02/22/2020   PLT 262 02/22/2020   Attestation Statements:   Reviewed by clinician on day of visit: allergies, medications, problem list, medical history, surgical history, family history, social history, and previous encounter notes.  Time spent on visit including pre-visit chart review and post-visit care and charting was 42 minutes. Time was spent on: Food choices and timing of food intake reviewed today. I performed a medically necessary appropriate examination and/or evaluation. I discussed the assessment and treatment plan with the  patient. The patient was provided an opportunity to ask questions and all were answered. The patient agreed with the plan and demonstrated an understanding of the instructions. Clinical information was updated and documented in the EMR.  I, Water quality scientist, CMA, am acting as transcriptionist for Briscoe Deutscher, DO  I have reviewed the above documentation for accuracy and completeness, and I agree with the above. -  Briscoe Deutscher, DO, MS, FAAFP, DABOM - Family and Bariatric Medicine.

## 2021-07-15 ENCOUNTER — Other Ambulatory Visit: Payer: Self-pay

## 2021-07-15 ENCOUNTER — Ambulatory Visit
Admission: RE | Admit: 2021-07-15 | Discharge: 2021-07-15 | Disposition: A | Discharge status: Home / Self Care | Payer: Medicaid Other | Source: Ambulatory Visit | Attending: Obstetrics & Gynecology | Admitting: Obstetrics & Gynecology

## 2021-07-15 DIAGNOSIS — N939 Abnormal uterine and vaginal bleeding, unspecified: Secondary | ICD-10-CM | POA: Diagnosis not present

## 2021-07-15 DIAGNOSIS — N946 Dysmenorrhea, unspecified: Secondary | ICD-10-CM | POA: Insufficient documentation

## 2021-07-15 DIAGNOSIS — N809 Endometriosis, unspecified: Secondary | ICD-10-CM | POA: Diagnosis present

## 2021-07-29 ENCOUNTER — Other Ambulatory Visit: Payer: Self-pay | Admitting: Obstetrics & Gynecology

## 2021-07-29 ENCOUNTER — Ambulatory Visit: Payer: Medicaid Other | Admitting: Obstetrics & Gynecology

## 2021-07-29 ENCOUNTER — Encounter (INDEPENDENT_AMBULATORY_CARE_PROVIDER_SITE_OTHER): Payer: Self-pay | Admitting: Family Medicine

## 2021-07-29 ENCOUNTER — Encounter: Payer: Self-pay | Admitting: Obstetrics & Gynecology

## 2021-07-29 ENCOUNTER — Ambulatory Visit (INDEPENDENT_AMBULATORY_CARE_PROVIDER_SITE_OTHER): Payer: Medicaid Other | Admitting: Family Medicine

## 2021-07-29 ENCOUNTER — Other Ambulatory Visit: Payer: Self-pay

## 2021-07-29 VITALS — BP 119/79 | HR 96

## 2021-07-29 VITALS — BP 105/64 | HR 61 | Temp 97.8°F | Ht 64.0 in | Wt 179.0 lb

## 2021-07-29 DIAGNOSIS — E669 Obesity, unspecified: Secondary | ICD-10-CM | POA: Diagnosis not present

## 2021-07-29 DIAGNOSIS — R87810 Cervical high risk human papillomavirus (HPV) DNA test positive: Secondary | ICD-10-CM | POA: Diagnosis not present

## 2021-07-29 DIAGNOSIS — Z9884 Bariatric surgery status: Secondary | ICD-10-CM

## 2021-07-29 DIAGNOSIS — F439 Reaction to severe stress, unspecified: Secondary | ICD-10-CM

## 2021-07-29 DIAGNOSIS — N939 Abnormal uterine and vaginal bleeding, unspecified: Secondary | ICD-10-CM

## 2021-07-29 DIAGNOSIS — R87610 Atypical squamous cells of undetermined significance on cytologic smear of cervix (ASC-US): Secondary | ICD-10-CM

## 2021-07-29 DIAGNOSIS — Z6832 Body mass index (BMI) 32.0-32.9, adult: Secondary | ICD-10-CM

## 2021-07-29 DIAGNOSIS — F5081 Binge eating disorder: Secondary | ICD-10-CM

## 2021-07-29 DIAGNOSIS — N736 Female pelvic peritoneal adhesions (postinfective): Secondary | ICD-10-CM | POA: Insufficient documentation

## 2021-07-29 DIAGNOSIS — N809 Endometriosis, unspecified: Secondary | ICD-10-CM

## 2021-07-29 HISTORY — DX: Female pelvic peritoneal adhesions (postinfective): N73.6

## 2021-07-29 LAB — POCT URINE PREGNANCY: Preg Test, Ur: NEGATIVE

## 2021-07-29 MED ORDER — LISDEXAMFETAMINE DIMESYLATE 20 MG PO CAPS: 20.0000 mg | ORAL_CAPSULE | Freq: Every morning | ORAL | 0 refills | Status: DC

## 2021-07-29 NOTE — Patient Instructions (Signed)
POST-PROCEDURE INSTRUCTIONS  You may take Ibuprofen, Aleve or Tylenol for cramping if needed.  If Monsel's solution was used, you will have a black discharge.  Light bleeding is normal.  If bleeding is heavier than your period, please call.  Put nothing in your vagina until the bleeding or discharge stops (usually 2 or 3 days).  We will call you within one week with biopsy results

## 2021-07-29 NOTE — Progress Notes (Signed)
Chief Complaint:   OBESITY Stacey Ramos is here to discuss her progress with her obesity treatment plan along with follow-up of her obesity related diagnoses. See Medical Weight Management Flowsheet for complete bioelectrical impedance results.  Today's visit was #: 3 Starting weight: 192 lbs Starting date: 06/18/2021 Weight change since last visit: 9 lbs Total lbs lost to date: 13 lbs Total weight loss percentage to date: -6.77%  Nutrition Plan: Keeping a food journal and adhering to recommended goals of 1000 calories and 85 grams of protein daily for 50% of the time. Activity: Strength training/cardio for 60-120 minutes 4-6 times per week.   Interim History: [name] says she has decreased her coffee intake to 1 large per day.  She says she will see her GYN today - reviewed Pap together.  She reports some improvement with Vyvanse, but says it wears off quickly.  She is supplementing with protein shakes.  Assessment/Plan:   1. Binge eating disorder [name] is taking Vyvanse 10 mg daily for BED.  She says it is helpful, but wears off quickly.  Plan:  Will increase Vyvanse to 20 mg daily and refill today, as per below.  The goals for treatment of BED are to reduce eating binges and to achieve healthy eating habits. Because binge eating can correlate with negative emotions, treatment may also address any other mental-health issues, such as depression.  People who binge eat feel as if they don't have control over how much they eat and have feelings of guilt or self-loathing after a binge eating episode.  The FDA has approved Vyvanse as a treatment option for binge eating disorder. Vyvanse targets the brain's reward center by increasing the levels of dopamine and norepinephrine, the chemicals of the brain responsible for feelings of pleasure.  Mindful eating is the recommended nutritional approach to treating BED.   - Increase lisdexamfetamine (VYVANSE) 20 MG capsule; Take 1 capsule (20 mg  total) by mouth every morning.  Dispense: 30 capsule; Refill: 0  I have consulted the [place] Controlled Substances Registry for this patient, and feel the risk/benefit ratio today is favorable for proceeding with this prescription for a controlled substance. The patient understands monitoring parameters and red flags.   2. Situational stress Diagnosis of ASCUS.  Will see Dr. Harolyn Rutherford today for endometrial biopsy and colposcopy.  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   Counseling You may need to eat 3 meals and 2 snacks, or 5 small meals each day in order to reach your protein and calorie goals.  Allow at least 15 minutes for each meal so that you can eat mindfully. Listen to your body so that you do not overeat. For most people, your sleeve or pouch will comfortably hold 4-6 ounces. Eat foods from all food groups. This includes fruits and vegetables, grains, dairy, and meat and other proteins. Include a protein-rich food at every meal and snack, and eat the protein food first.  You should be taking a Bariatric Multivitamin as well as calcium.   4. Obesity BMI today is 30  Course: [name] is currently in the action stage of change. As such, her goal is to continue with weight loss efforts.   Nutrition goals: She has agreed to keeping a food journal and adhering to recommended goals of 1000 calories and 85 grams of protein.   Exercise goals:  As is.  Behavioral modification strategies: increasing lean protein intake, decreasing simple carbohydrates,  increasing vegetables, increasing water intake, decreasing liquid calories, and emotional eating strategies.  [name] has agreed to follow-up with our clinic in 3 weeks. She was informed of the importance of frequent follow-up visits to maximize her success with intensive lifestyle modifications for her multiple health conditions.   Objective:   Blood pressure 105/64, pulse  61, temperature 97.8 F (36.6 C), temperature source Oral, height 5\' 4"  (1.626 m), weight 179 lb (81.2 kg), last menstrual period 06/30/2021, SpO2 100 %, unknown if currently breastfeeding. Body mass index is 30.73 kg/m.  General: Cooperative, alert, well developed, in no acute distress. HEENT: Conjunctivae and lids unremarkable. Cardiovascular: Regular rhythm.  Lungs: Normal work of breathing. Neurologic: No focal deficits.   Lab Results  Component Value Date   CREATININE 0.60 02/22/2020   BUN 15 02/22/2020   NA 140 02/22/2020   K 4.0 02/22/2020   CL 103 02/22/2020   CO2 27 02/22/2020   Lab Results  Component Value Date   ALT 13 02/22/2020   AST 25 02/22/2020   ALKPHOS 41 02/22/2020   BILITOT 0.6 02/22/2020   Lab Results  Component Value Date   HGBA1C 4.9 09/16/2019   Lab Results  Component Value Date   INSULIN 8.6 06/18/2021   Lab Results  Component Value Date   TSH 1.457 09/16/2019   Lab Results  Component Value Date   WBC 6.5 02/22/2020   HGB 13.6 02/22/2020   HCT 41.1 02/22/2020   MCV 95.4 02/22/2020   PLT 262 02/22/2020   Attestation Statements:   Reviewed by clinician on day of visit: allergies, medications, problem list, medical history, surgical history, family history, social history, and previous encounter notes.  I, Water quality scientist, CMA, am acting as transcriptionist for Briscoe Deutscher, DO  I have reviewed the above documentation for accuracy and completeness, and I agree with the above. -  Briscoe Deutscher, DO, MS, FAAFP, DABOM - Family and Bariatric Medicine.

## 2021-07-29 NOTE — Progress Notes (Signed)
GYNECOLOGY OFFICE PROCEDURE NOTE   Stacey Ramos is a 40 y.o. G2P1011 here for colposcopy and endometrial biopsy for ASCUS + HPV pap smear on 07/04/2021 and abnormal uterine bleeding. Also here to follow up on recent ultrasound results. Today, she reports no concerning symptoms.   US PELVIC COMPLETE WITH TRANSVAGINAL  Result Date: 07/15/2021 CLINICAL DATA:  Abnormal uterine bleeding status post failed ablation Prior right salpingo oophorectomy History of endometriosis and PCOS EXAM: TRANSABDOMINAL AND TRANSVAGINAL ULTRASOUND OF PELVIS TECHNIQUE: Both transabdominal and transvaginal ultrasound examinations of the pelvis were performed. Transabdominal technique was performed for global imaging of the pelvis including uterus, ovaries, adnexal regions, and pelvic cul-de-sac. It was necessary to proceed with endovaginal exam following the transabdominal exam to visualize the uterus, endometrium, left ovary. COMPARISON:  02/17/2020 FINDINGS: Uterus Measurements: 8.2 x 4.1 x 5.5 cm = volume: 95 mL. No fibroids or other mass visualized. Endometrium Thickness: 10 mm.  No focal abnormality visualized. Right ovary Surgically absent Left ovary Measurements: 3.6 x 2.0 x 3.0 cm = volume: 11 mL. Normal appearance/no adnexal mass. Other findings No abnormal free fluid. IMPRESSION: No significant sonographic abnormality of the uterus or left ovary. If bleeding remains unresponsive to hormonal or medical therapy, sonohysterogram should be considered for focal lesion work-up. (Ref: Radiological Reasoning: Algorithmic Workup of Abnormal Vaginal Bleeding with Endovaginal Sonography and Sonohysterography. AJR 2008; 202:[id number]) Electronically Signed   By: Miachel Roux M.D.   On: 07/15/2021 15:26    COLPOSCOPY NOTE Indication: ASCUS + HPV pap smear on 07/04/2021 Discussed role for HPV in cervical dysplasia, need for surveillance. Patient gave informed written consent, time out was performed.  Patient was positioned  in dorsal lithotomy position. A vaginal speculum was placed. Cervix viewed with speculum and colposcope after application of acetic acid.  Colposcopy adequate? Yes Acetowhite lesion noted at 1 o'clock; corresponding biopsy obtained.  ECC specimen obtained. All specimens were labeled and sent to pathology.   ENDOMETRIAL BIOPSY NOTE Indication:  Abnormal uterine bleeding Risks of the biopsy including cramping, bleeding, infection, uterine perforation, inadequate specimen and need for additional procedures were discussed. Offered alternative of hysteroscopy, dilation and curettage in OR. The patient states she understands the R/B/I/A and agrees to undergo procedure today. Urine pregnancy test was negative. Consent was signed. Time out was performed.   After the colposcopy, the cervix was was prepped with Betadine.  The 3 mm pipelle was easily introduced into the endometrial cavity without difficulty to a depth of 8 cm, and a moderate amount of tissue was obtained and sent to pathology. The instruments were removed from the patient's vagina. Minimal bleeding from the cervix was noted. The patient tolerated the procedure well.   Patient was given post procedure instructions.  Will follow up pathology and manage accordingly; patient will be contacted with results and recommendations.    Ultrasound results reviewed in detail.  In talking to patient, she had ablation of endometriosis lesions in 2019 not endometrial ablation. This was offered as management of her AUB in lieu of her desired hysterectomy given the extensive pelvic adhesions secondary to endometriosis and high surgical risks.  This was discussed with patient, she was given information to review at home.  Also sent her chart for review by my partners who do more minimally invasive surgery, to give recommendations about hysterectomy modality.  Will decide about management plan after patient does her research, I hear from my partners and pending  results of today's evaluation.  Verita Schneiders, MD, [place] for [employer], [place]

## 2021-08-02 ENCOUNTER — Telehealth: Payer: Self-pay

## 2021-08-02 NOTE — Telephone Encounter (Signed)
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  This is consistent with the pap smear abnormality (ASCUS +HRHPV).  Patient will need repeat pap and HPV test in one year.  Please call to inform patient of results and recommendations.    Verita Schneiders, MD

## 2021-08-02 NOTE — Telephone Encounter (Signed)
I was able to contact patient and make her aware of recent colpo results and provider advice  Pt aware to repeat pap in 1 yr Pt voiced understanding.

## 2021-08-02 NOTE — Telephone Encounter (Signed)
TC to pt regarding recent colpo results Pt not ava lvm for pt to return call to office.

## 2021-08-09 ENCOUNTER — Other Ambulatory Visit: Payer: Self-pay

## 2021-08-09 ENCOUNTER — Ambulatory Visit
Admission: RE | Admit: 2021-08-09 | Discharge: 2021-08-09 | Disposition: A | Discharge status: Home / Self Care | Payer: Medicaid Other | Source: Ambulatory Visit | Attending: Obstetrics & Gynecology | Admitting: Obstetrics & Gynecology

## 2021-08-09 DIAGNOSIS — Z1231 Encounter for screening mammogram for malignant neoplasm of breast: Secondary | ICD-10-CM

## 2021-08-20 ENCOUNTER — Ambulatory Visit (INDEPENDENT_AMBULATORY_CARE_PROVIDER_SITE_OTHER): Payer: Medicaid Other | Admitting: Family Medicine

## 2021-08-20 ENCOUNTER — Encounter (INDEPENDENT_AMBULATORY_CARE_PROVIDER_SITE_OTHER): Payer: Self-pay | Admitting: Family Medicine

## 2021-08-20 ENCOUNTER — Other Ambulatory Visit: Payer: Self-pay

## 2021-08-20 VITALS — BP 83/50 | HR 62 | Temp 97.8°F | Ht 64.0 in | Wt 174.0 lb

## 2021-08-20 DIAGNOSIS — E669 Obesity, unspecified: Secondary | ICD-10-CM | POA: Diagnosis not present

## 2021-08-20 DIAGNOSIS — Z9884 Bariatric surgery status: Secondary | ICD-10-CM | POA: Diagnosis not present

## 2021-08-20 DIAGNOSIS — F439 Reaction to severe stress, unspecified: Secondary | ICD-10-CM | POA: Diagnosis not present

## 2021-08-20 DIAGNOSIS — F5081 Binge eating disorder: Secondary | ICD-10-CM

## 2021-08-20 DIAGNOSIS — Z6832 Body mass index (BMI) 32.0-32.9, adult: Secondary | ICD-10-CM

## 2021-08-20 MED ORDER — VYVANSE 40 MG PO CHEW: 40.0000 mg | CHEWABLE_TABLET | Freq: Every day | ORAL | 0 refills | Status: DC

## 2021-08-21 NOTE — Progress Notes (Signed)
Chief Complaint:   OBESITY Stacey Ramos is here to discuss her progress with her obesity treatment plan along with follow-up of her obesity related diagnoses. See Medical Weight Management Flowsheet for complete bioelectrical impedance results.  Today's visit was #: 4 Starting weight: 192 lbs Starting date: 06/18/2021 Weight change since last visit: 5 lbs Total lbs lost to date: 18 lbs Total weight loss percentage to date: -9.38%  Nutrition Plan: Keeping a food journal and adhering to recommended goals of 1000 calories and 85 grams of protein daily for 70-80% of the time. Activity: Cardio/strength training for 60-120 minutes 2-3 times per week.   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  Repeat Pap, HPV in 1 year.  She says she enjoyed her trip to [place].  Stacey helped decrease anxiety and impulsivity.  Assessment/Plan:   1. History of Roux-en-Y gastric bypass Stacey Ramos is at risk for malnutrition due to her previous bariatric surgery.   Counseling You may need to eat 3 meals and 2 snacks, or 5 small meals each day in order to reach your protein and calorie goals.  Allow at least 15 minutes for each meal so that you can eat mindfully. Listen to your body so that you do not overeat. For most people, your sleeve or pouch will comfortably hold 4-6 ounces. Eat foods from all food groups. This includes fruits and vegetables, grains, dairy, and meat and other proteins. Include a protein-rich food at every meal and snack, and eat the protein food first.  You should be taking a Bariatric Multivitamin as well as calcium.   2. Binge eating disorder Stacey Ramos is taking Stacey 20 mg daily for BED.  She says it is helpful, but wears off quickly.   Plan:  Will increase Stacey to 40 mg daily and refill today, as per below.  The goals for treatment of BED are to reduce eating binges and to achieve healthy eating habits. Because binge eating can correlate with negative emotions,  treatment may also address any other mental-health issues, such as depression. People who binge eat feel as if they don't have control over how much they eat and have feelings of guilt or self-loathing after a binge eating episode.  The FDA has approved Stacey as a treatment option for binge eating disorder. Stacey targets the brain's reward center by increasing the levels of dopamine and norepinephrine, the chemicals of the brain responsible for feelings of pleasure.  Mindful eating is the recommended nutritional approach to treating BED.   - Refill Stacey [name] 40 MG CHEW; Chew 40 mg by mouth daily.  Dispense: 30 tablet; Refill: 0  I have consulted the [place] Controlled Substances Registry for this patient, and feel the risk/benefit ratio today is favorable for proceeding with this prescription for a controlled substance. The patient understands monitoring parameters and red flags.   3. Situational stress Recent colposcopy with diagnosis of CIN1.  Repeat Pap and HPV in 1 year. Behavior modification techniques were discussed today to help deal with emotional/non-hunger eating behaviors.  4. Obesity BMI today is 30.0  Course: Stacey Ramos is currently in the action stage of change. As such, her goal is to continue with weight loss efforts.   Nutrition goals: She has agreed to keeping a food journal and adhering to recommended goals of 1000 calories and 85 grams of protein.   Exercise goals:  As is.  Behavioral modification strategies: increasing lean protein intake, decreasing simple carbohydrates, increasing vegetables, and increasing water  intake.  Stacey Ramos has agreed to follow-up with our clinic in 4 weeks. She was informed of the importance of frequent follow-up visits to maximize her success with intensive lifestyle modifications for her multiple health conditions.   Objective:   Blood pressure (!) 83/50, pulse 62, temperature 97.8 F (36.6 C), temperature source Oral,  height 5\' 4"  (1.626 m), weight 174 lb (78.9 kg), SpO2 99 %, unknown if currently breastfeeding. Body mass index is 29.87 kg/m.  General: Cooperative, alert, well developed, in no acute distress. HEENT: Conjunctivae and lids unremarkable. Cardiovascular: Regular rhythm.  Lungs: Normal work of breathing. Neurologic: No focal deficits.   Lab Results  Component Value Date   CREATININE 0.60 02/22/2020   BUN 15 02/22/2020   NA 140 02/22/2020   K 4.0 02/22/2020   CL 103 02/22/2020   CO2 27 02/22/2020   Lab Results  Component Value Date   ALT 13 02/22/2020   AST 25 02/22/2020   ALKPHOS 41 02/22/2020   BILITOT 0.6 02/22/2020   Lab Results  Component Value Date   HGBA1C 4.9 09/16/2019   Lab Results  Component Value Date   INSULIN 8.6 06/18/2021   Lab Results  Component Value Date   TSH 1.457 09/16/2019   Lab Results  Component Value Date   WBC 6.5 02/22/2020   HGB 13.6 02/22/2020   HCT 41.1 02/22/2020   MCV 95.4 02/22/2020   PLT 262 02/22/2020   Attestation Statements:   Reviewed by clinician on day of visit: allergies, medications, problem list, medical history, surgical history, family history, social history, and previous encounter notes.  I, Water quality scientist, CMA, am acting as transcriptionist for Briscoe Deutscher, DO  I have reviewed the above documentation for accuracy and completeness, and I agree with the above. -  Briscoe Deutscher, DO, MS, FAAFP, DABOM - Family and Bariatric Medicine.

## 2021-09-03 ENCOUNTER — Emergency Department (HOSPITAL_COMMUNITY)
Admission: EM | Admit: 2021-09-03 | Discharge: 2021-09-03 | Discharge status: Against Medical Advice | Payer: Medicaid Other | Attending: Emergency Medicine | Admitting: Emergency Medicine

## 2021-09-03 NOTE — ED Notes (Signed)
Pt was called for vitals no answer 

## 2021-09-03 NOTE — ED Notes (Signed)
Pt called 3x no answer  

## 2021-09-22 ENCOUNTER — Other Ambulatory Visit (INDEPENDENT_AMBULATORY_CARE_PROVIDER_SITE_OTHER): Payer: Self-pay | Admitting: Family Medicine

## 2021-09-22 DIAGNOSIS — F5081 Binge eating disorder: Secondary | ICD-10-CM

## 2021-09-24 MED ORDER — VYVANSE 40 MG PO CHEW: 40.0000 mg | CHEWABLE_TABLET | Freq: Every day | ORAL | 0 refills | Status: DC

## 2021-10-02 ENCOUNTER — Encounter (INDEPENDENT_AMBULATORY_CARE_PROVIDER_SITE_OTHER): Payer: Self-pay

## 2021-10-02 ENCOUNTER — Ambulatory Visit (INDEPENDENT_AMBULATORY_CARE_PROVIDER_SITE_OTHER): Payer: Medicaid Other | Admitting: Family Medicine

## 2021-10-03 ENCOUNTER — Ambulatory Visit (INDEPENDENT_AMBULATORY_CARE_PROVIDER_SITE_OTHER): Payer: Medicaid Other | Admitting: Family Medicine

## 2021-10-03 ENCOUNTER — Other Ambulatory Visit: Payer: Self-pay

## 2021-10-03 ENCOUNTER — Encounter (INDEPENDENT_AMBULATORY_CARE_PROVIDER_SITE_OTHER): Payer: Self-pay | Admitting: Family Medicine

## 2021-10-03 VITALS — BP 109/69 | HR 72 | Temp 98.5°F | Ht 64.0 in | Wt 164.0 lb

## 2021-10-03 DIAGNOSIS — Z6828 Body mass index (BMI) 28.0-28.9, adult: Secondary | ICD-10-CM | POA: Diagnosis not present

## 2021-10-03 DIAGNOSIS — E669 Obesity, unspecified: Secondary | ICD-10-CM | POA: Diagnosis not present

## 2021-10-03 DIAGNOSIS — F5081 Binge eating disorder: Secondary | ICD-10-CM | POA: Diagnosis not present

## 2021-10-03 DIAGNOSIS — Z6832 Body mass index (BMI) 32.0-32.9, adult: Secondary | ICD-10-CM

## 2021-10-03 MED ORDER — VYVANSE 40 MG PO CHEW: 40.0000 mg | CHEWABLE_TABLET | Freq: Every day | ORAL | 0 refills | Status: DC

## 2021-10-07 NOTE — Progress Notes (Signed)
Chief Complaint:   OBESITY Stacey Ramos is here to discuss her progress with her obesity treatment plan along with follow-up of her obesity related diagnoses. Stacey Ramos is on keeping a food journal and adhering to recommended goals of 1000 calories and 80 grams protein and states she is following her eating plan approximately 80-85% of the time. Stacey Ramos states she is weight training and doing cardio 60-120 minutes 2-3 times per week.  Today's visit was #: 5 Starting weight: 192 lbs Starting date: 06/18/2021 Today's weight: 164 lbs Today's date: 10/03/2021 Total lbs lost to date: 28 Total lbs lost since last in-office visit: 10  Interim History: Breakfast= 1 Fairlife chocolate protein shake or premier protein + 1 cheese stick; Lunch= 1 protein shake + 1 coffee; Dinner= healthy choice meal with 20+ grams protein + 250-350 calories each; Snacks= 2 cheese sticks + 1 Greek yogurt.  Subjective:   1. Binge eating disorder Dr. Juleen China increased pt's Vyvanse from 20 mg to 40 mg at her last OV. Per pt, needs more.  Assessment/Plan:  No orders of the defined types were placed in this encounter.   Medications Discontinued During This Encounter  Medication Reason   Lisdexamfetamine Dimesylate (VYVANSE) 40 MG CHEW Reorder     Meds ordered this encounter  Medications   Lisdexamfetamine Dimesylate (VYVANSE) 40 MG CHEW    Sig: Chew 40 mg by mouth daily for 28 days.    Dispense:  30 tablet    Refill:  0    Not due to be filled until 10/21/21     1. Binge eating disorder Not due for refill of Vyvanse 40 mg until 10/22/2021. Dr. Juleen China can decide on changes in medication management at next OV. PDMP checked and validated.  Refill due 10/22/21- Lisdexamfetamine Dimesylate (VYVANSE) 40 MG CHEW; Chew 40 mg by mouth daily for 28 days.  Dispense: 30 tablet; Refill: 0  2. Obesity with current BMI of 28.2  Stacey Ramos is currently in the action stage of change. As such, her goal is to continue with weight loss  efforts. She has agreed to keeping a food journal and adhering to recommended goals of 1,000 calories and 80+ grams protein.   Exercise goals:  As is  Behavioral modification strategies: increasing lean protein intake, decreasing simple carbohydrates, no skipping meals, and planning for success.  Stacey Ramos has agreed to follow-up with our clinic in 5-6 weeks. She was informed of the importance of frequent follow-up visits to maximize her success with intensive lifestyle modifications for her multiple health conditions.   Objective:   Blood pressure 109/69, pulse 72, temperature 98.5 F (36.9 C), height 5\' 4"  (1.626 m), weight 164 lb (74.4 kg), SpO2 99 %, unknown if currently breastfeeding. Body mass index is 28.15 kg/m.  General: Cooperative, alert, well developed, in no acute distress. HEENT: Conjunctivae and lids unremarkable. Cardiovascular: Regular rhythm.  Lungs: Normal work of breathing. Neurologic: No focal deficits.   Lab Results  Component Value Date   CREATININE 0.60 02/22/2020   BUN 15 02/22/2020   NA 140 02/22/2020   K 4.0 02/22/2020   CL 103 02/22/2020   CO2 27 02/22/2020   Lab Results  Component Value Date   ALT 13 02/22/2020   AST 25 02/22/2020   ALKPHOS 41 02/22/2020   BILITOT 0.6 02/22/2020   Lab Results  Component Value Date   HGBA1C 4.9 09/16/2019   Lab Results  Component Value Date   INSULIN 8.6 06/18/2021   Lab Results  Component Value  Date   TSH 1.457 09/16/2019   No results found for: CHOL, HDL, LDLCALC, LDLDIRECT, TRIG, CHOLHDL No results found for: VD25OH Lab Results  Component Value Date   WBC 6.5 02/22/2020   HGB 13.6 02/22/2020   HCT 41.1 02/22/2020   MCV 95.4 02/22/2020   PLT 262 02/22/2020    Attestation Statements:   Reviewed by clinician on day of visit: allergies, medications, problem list, medical history, surgical history, family history, social history, and previous encounter notes.  Coral Ceo, CMA, am acting as  transcriptionist for [employer], DO.  I have reviewed the above documentation for accuracy and completeness, and I agree with the above. Marjory Sneddon, D.O.  The [place] was signed into law in 2016 which includes the topic of electronic health records.  This provides immediate access to information in MyChart.  This includes consultation notes, operative notes, office notes, lab results and pathology reports.  If you have any questions about what you read please let us know at your next visit so we can discuss your concerns and take corrective action if need be.  We are right here with you.

## 2021-10-23 ENCOUNTER — Other Ambulatory Visit (INDEPENDENT_AMBULATORY_CARE_PROVIDER_SITE_OTHER): Payer: Self-pay | Admitting: Family Medicine

## 2021-10-23 ENCOUNTER — Encounter (INDEPENDENT_AMBULATORY_CARE_PROVIDER_SITE_OTHER): Payer: Self-pay | Admitting: Family Medicine

## 2021-10-23 DIAGNOSIS — F5081 Binge eating disorder: Secondary | ICD-10-CM

## 2021-10-23 NOTE — Telephone Encounter (Signed)
Dr.Opalski ?

## 2021-11-14 ENCOUNTER — Encounter (INDEPENDENT_AMBULATORY_CARE_PROVIDER_SITE_OTHER): Payer: Self-pay | Admitting: Family Medicine

## 2021-11-14 ENCOUNTER — Ambulatory Visit (INDEPENDENT_AMBULATORY_CARE_PROVIDER_SITE_OTHER): Payer: Medicaid Other | Admitting: Family Medicine

## 2021-11-14 ENCOUNTER — Other Ambulatory Visit: Payer: Self-pay

## 2021-11-14 VITALS — BP 103/64 | HR 66 | Temp 97.8°F | Ht 64.0 in | Wt 162.0 lb

## 2021-11-14 DIAGNOSIS — Z6827 Body mass index (BMI) 27.0-27.9, adult: Secondary | ICD-10-CM

## 2021-11-14 DIAGNOSIS — K760 Fatty (change of) liver, not elsewhere classified: Secondary | ICD-10-CM | POA: Diagnosis not present

## 2021-11-14 DIAGNOSIS — F439 Reaction to severe stress, unspecified: Secondary | ICD-10-CM | POA: Diagnosis not present

## 2021-11-14 DIAGNOSIS — F5081 Binge eating disorder: Secondary | ICD-10-CM

## 2021-11-14 DIAGNOSIS — E669 Obesity, unspecified: Secondary | ICD-10-CM

## 2021-11-14 DIAGNOSIS — Z6832 Body mass index (BMI) 32.0-32.9, adult: Secondary | ICD-10-CM

## 2021-11-14 MED ORDER — LISDEXAMFETAMINE DIMESYLATE 50 MG PO CAPS: 50.0000 mg | ORAL_CAPSULE | Freq: Every day | ORAL | 0 refills | Status: DC

## 2021-11-19 NOTE — Progress Notes (Signed)
Chief Complaint:   OBESITY Stacey Ramos is here to discuss her progress with her obesity treatment plan along with follow-up of her obesity related diagnoses. See Medical Weight Management Flowsheet for complete bioelectrical impedance results.  Today's visit was #: 6 Starting weight: 192 lbs Starting date: 06/18/2021 Weight change since last visit: 2 lbs Total lbs lost to date: 30 lbs Total weight loss percentage to date: -15.63%  Nutrition Plan: Keeping a food journal and adhering to recommended goals of 1000 calories and 80+ grams of protein daily for a small percentage of the time. Activity: Increased walking.  Interim History: Stacey Ramos says that her sister is living with her.  She says that work has been busy.  She has been out of Vyvanse for 1 week.  Assessment/Plan:   1. Fatty liver Intensive lifestyle modifications are the first line treatment for this issue. We discussed several lifestyle modifications today and she will continue to work on diet, exercise and weight loss efforts.   Counseling: NAFLD is an umbrella term that encompasses a disease spectrum that includes steatosis (fat) without inflammation, steatohepatitis (NASH; fat + inflammation in a characteristic pattern), and cirrhosis. Bland steatosis is felt to be a benign condition, with extremely low to no risk of progression to cirrhosis, whereas NASH can progress to cirrhosis. The mainstay of treatment of NAFLD includes lifestyle modification to achieve weight loss, at least 7% of current body weight. Low carbohydrate diets can be beneficial in improving NAFLD liver histology. Additionally, exercise, even the absence of weight loss can have beneficial effects on the patient's metabolic profile and liver health. We recommend that their metabolic comorbidities be aggressively managed, as patients with NAFLD are at increased risk of coronary artery disease.  2. Binge eating disorder Stacey Ramos is currently taking Vyvanse 40 mg  daily for BED.  Plan:  Increase Vyvanse to 50 mg daily, as per below.  The goals for treatment of BED are to reduce eating binges and to achieve healthy eating habits. Because binge eating can correlate with negative emotions, treatment may also address any other mental-health issues, such as depression.  People who binge eat feel as if they don't have control over how much they eat and have feelings of guilt or self-loathing after a binge eating episode.  The FDA has approved Vyvanse as a treatment option for binge eating disorder. Vyvanse targets the brain's reward center by increasing the levels of dopamine and norepinephrine, the chemicals of the brain responsible for feelings of pleasure.  Mindful eating is the recommended nutritional approach to treating BED.   - Increase lisdexamfetamine (VYVANSE) 50 MG capsule; Take 1 capsule (50 mg total) by mouth daily before breakfast.  Dispense: 30 capsule; Refill: 0 - lisdexamfetamine (VYVANSE) 50 MG capsule; Take 1 capsule (50 mg total) by mouth daily before breakfast.  Dispense: 30 capsule; Refill: 0 - lisdexamfetamine (VYVANSE) 50 MG capsule; Take 1 capsule (50 mg total) by mouth daily before breakfast.  Dispense: 30 capsule; Refill: 0  I have consulted the [place] Controlled Substances Registry for this patient, and feel the risk/benefit ratio today is favorable for proceeding with this prescription for a controlled substance. The patient understands monitoring parameters and red flags.   3. Situational stress We will continue to monitor as it relates to her weight loss journey.  4. Obesity, current BMI of 27.8  Course: Stacey Ramos is currently in the action stage of change. As such, her goal is to continue with weight loss efforts.   Nutrition goals:  She has agreed to the Category 1 Plan and keeping a food journal and adhering to recommended goals of 1000 calories and 80+ grams of protein.   Exercise goals:  As is.  Behavioral modification  strategies: increasing lean protein intake, decreasing simple carbohydrates, and increasing vegetables.  Stacey Ramos has agreed to follow-up with our clinic in 4-6 weeks. She was informed of the importance of frequent follow-up visits to maximize her success with intensive lifestyle modifications for her multiple health conditions.   Objective:   Blood pressure 103/64, pulse 66, temperature 97.8 F (36.6 C), temperature source Oral, height 5\' 4"  (1.626 m), weight 162 lb (73.5 kg), SpO2 100 %, unknown if currently breastfeeding. Body mass index is 27.81 kg/m.  General: Cooperative, alert, well developed, in no acute distress. HEENT: Conjunctivae and lids unremarkable. Cardiovascular: Regular rhythm.  Lungs: Normal work of breathing. Neurologic: No focal deficits.   Lab Results  Component Value Date   CREATININE 0.60 02/22/2020   BUN 15 02/22/2020   NA 140 02/22/2020   K 4.0 02/22/2020   CL 103 02/22/2020   CO2 27 02/22/2020   Lab Results  Component Value Date   ALT 13 02/22/2020   AST 25 02/22/2020   ALKPHOS 41 02/22/2020   BILITOT 0.6 02/22/2020   Lab Results  Component Value Date   HGBA1C 4.9 09/16/2019   Lab Results  Component Value Date   INSULIN 8.6 06/18/2021   Lab Results  Component Value Date   TSH 1.457 09/16/2019   Lab Results  Component Value Date   WBC 6.5 02/22/2020   HGB 13.6 02/22/2020   HCT 41.1 02/22/2020   MCV 95.4 02/22/2020   PLT 262 02/22/2020   Attestation Statements:   Reviewed by clinician on day of visit: allergies, medications, problem list, medical history, surgical history, family history, social history, and previous encounter notes.  I, Water quality scientist, CMA, am acting as transcriptionist for Briscoe Deutscher, DO  I have reviewed the above documentation for accuracy and completeness, and I agree with the above. -  Briscoe Deutscher, DO, MS, FAAFP, DABOM - Family and Bariatric Medicine.

## 2021-12-13 ENCOUNTER — Other Ambulatory Visit (INDEPENDENT_AMBULATORY_CARE_PROVIDER_SITE_OTHER): Payer: Self-pay | Admitting: Family Medicine

## 2021-12-13 DIAGNOSIS — F5081 Binge eating disorder: Secondary | ICD-10-CM

## 2021-12-16 ENCOUNTER — Ambulatory Visit (INDEPENDENT_AMBULATORY_CARE_PROVIDER_SITE_OTHER): Payer: Medicaid Other | Admitting: Family Medicine

## 2021-12-16 ENCOUNTER — Other Ambulatory Visit: Payer: Self-pay

## 2021-12-16 ENCOUNTER — Encounter (INDEPENDENT_AMBULATORY_CARE_PROVIDER_SITE_OTHER): Payer: Self-pay | Admitting: Family Medicine

## 2021-12-16 VITALS — BP 104/70 | HR 81 | Temp 97.9°F | Ht 64.0 in | Wt 163.0 lb

## 2021-12-16 DIAGNOSIS — E669 Obesity, unspecified: Secondary | ICD-10-CM

## 2021-12-16 DIAGNOSIS — E282 Polycystic ovarian syndrome: Secondary | ICD-10-CM | POA: Diagnosis not present

## 2021-12-16 DIAGNOSIS — F5081 Binge eating disorder: Secondary | ICD-10-CM

## 2021-12-16 DIAGNOSIS — F439 Reaction to severe stress, unspecified: Secondary | ICD-10-CM

## 2021-12-16 DIAGNOSIS — Z6828 Body mass index (BMI) 28.0-28.9, adult: Secondary | ICD-10-CM

## 2021-12-17 MED ORDER — LISDEXAMFETAMINE DIMESYLATE 50 MG PO CAPS: 50.0000 mg | ORAL_CAPSULE | Freq: Every day | ORAL | 0 refills | Status: DC

## 2021-12-19 ENCOUNTER — Ambulatory Visit (INDEPENDENT_AMBULATORY_CARE_PROVIDER_SITE_OTHER): Payer: Medicaid Other | Admitting: Family Medicine

## 2021-12-24 NOTE — Progress Notes (Signed)
Chief Complaint:   OBESITY Stacey Ramos is here to discuss her progress with her obesity treatment plan along with follow-up of her obesity related diagnoses. See Medical Weight Management Flowsheet for complete bioelectrical impedance results.  Today's visit was #: 7 Starting weight: 192 lbs Starting date: 06/18/2021 Weight change since last visit: +1 lb Total lbs lost to date: 29 lbs Total weight loss percentage to date: -10.93%  Nutrition Plan: Category 1 Meal Plan and Keeping a food journal and adhering to recommended goals of 1000 calories and 80+ grams of protein daily for 0% of the time. Activity: Increased walking.  Interim History: Stacey Ramos has been under increased stress.  Her roommate moved out, so having to manage all costs. She says his sister is helpful.  Assessment/Plan:   1. PCOS (polycystic ovarian syndrome) She will continue to focus on protein-rich, low simple carbohydrate foods. We reviewed the importance of hydration, regular exercise for stress reduction, and restorative sleep.  2. Binge eating disorder Stacey Ramos is currently taking Vyvanse 50 mg daily for BED.   Plan:  Increase Vyvanse to 50 mg daily, as per below.  The goals for treatment of BED are to reduce eating binges and to achieve healthy eating habits. Because binge eating can correlate with negative emotions, treatment may also address any other mental-health issues, such as depression.  People who binge eat feel as if they don't have control over how much they eat and have feelings of guilt or self-loathing after a binge eating episode.  The FDA has approved Vyvanse as a treatment option for binge eating disorder. Vyvanse targets the brain's reward center by increasing the levels of dopamine and norepinephrine, the chemicals of the brain responsible for feelings of pleasure.  Mindful eating is the recommended nutritional approach to treating BED.   3. Situational stress Provided psychoeducation and  facilitated discussion regarding eating as habit, self-monitoring, stimulus control, regulating eating pattern, coping skills, and barriers to success.  We will continue to monitor as it relates to her weight loss journey.  4. Obesity, current BMI of 28  Course: Stacey Ramos is currently in the action stage of change. As such, her goal is to continue with weight loss efforts.   Nutrition goals: She has agreed to the Category 1 Plan and keeping a food journal and adhering to recommended goals of 1000 calories and 80* grams of protein.   Exercise goals:  As is.  Behavioral modification strategies: increasing lean protein intake, decreasing simple carbohydrates, and increasing vegetables.  Stacey Ramos has agreed to follow-up with our clinic in 6 weeks. She was informed of the importance of frequent follow-up visits to maximize her success with intensive lifestyle modifications for her multiple health conditions.   Objective:   Blood pressure 104/70, pulse 81, temperature 97.9 F (36.6 C), temperature source Oral, height '5\' 4"'$  (1.626 m), weight 163 lb (73.9 kg), SpO2 100 %, unknown if currently breastfeeding. Body mass index is 27.98 kg/m.  General: Cooperative, alert, well developed, in no acute distress. HEENT: Conjunctivae and lids unremarkable. Cardiovascular: Regular rhythm.  Lungs: Normal work of breathing. Neurologic: No focal deficits.   Lab Results  Component Value Date   CREATININE 0.60 02/22/2020   BUN 15 02/22/2020   NA 140 02/22/2020   K 4.0 02/22/2020   CL 103 02/22/2020   CO2 27 02/22/2020   Lab Results  Component Value Date   ALT 13 02/22/2020   AST 25 02/22/2020   ALKPHOS 41 02/22/2020   BILITOT 0.6 02/22/2020  Lab Results  Component Value Date   HGBA1C 4.9 09/16/2019   Lab Results  Component Value Date   INSULIN 8.6 06/18/2021   Lab Results  Component Value Date   TSH 1.457 09/16/2019   Lab Results  Component Value Date   WBC 6.5 02/22/2020   HGB 13.6  02/22/2020   HCT 41.1 02/22/2020   MCV 95.4 02/22/2020   PLT 262 02/22/2020   Attestation Statements:   Reviewed by clinician on day of visit: allergies, medications, problem list, medical history, surgical history, family history, social history, and previous encounter notes.  I, Water quality scientist, CMA, am acting as transcriptionist for Briscoe Deutscher, DO  I have reviewed the above documentation for accuracy and completeness, and I agree with the above. -  Briscoe Deutscher, DO, MS, FAAFP, DABOM - Family and Bariatric Medicine.

## 2022-01-03 ENCOUNTER — Ambulatory Visit: Payer: Medicaid Other | Admitting: Cardiology

## 2022-01-03 ENCOUNTER — Encounter: Payer: Self-pay | Admitting: Cardiology

## 2022-01-03 VITALS — BP 115/74 | HR 80 | Temp 98.0°F | Resp 16 | Ht 64.0 in | Wt 167.0 lb

## 2022-01-03 DIAGNOSIS — R011 Cardiac murmur, unspecified: Secondary | ICD-10-CM

## 2022-01-03 HISTORY — DX: Cardiac murmur, unspecified: R01.1

## 2022-01-03 NOTE — Progress Notes (Signed)
? ? ?Patient referred by Audley Hose, MD for murmur ? ?Subjective:  ? ?Stacey Ramos, female    DOB: December 30, 1980, 41 y.o.   MRN: [id number] ? ? ?Chief Complaint  ?Patient presents with  ? Heart Murmur  ? New Patient (Initial Visit)  ? ? ? ?HPI ? ?41 y.o. African American female with obesity, h/o gastric bypass surgery, multinodular goiter, asthma, referred for evaluation of murmur ? ?Patient was last seen by Dr. Einar Gip during hospitalization in 2020 after an episode of orthostatic hypotension and syncope.  At that time, echocardiogram did not show any significant abnormalities.  Cortisol deficiency work-up was initiated but not completed.  Fortunately, patient has had no recurrent episodes of syncope or presyncope since then.  She denies any complaints today. ? ? ?Past Medical History:  ?Diagnosis Date  ? Anemia   ? Anxiety   ? Asthma   ? Back pain   ? Depression   ? Dysmenorrhea   ? Ectopic pregnancy 02/17/2020  ? Endometriosis   ? Fatty liver   ? GERD (gastroesophageal reflux disease)   ? Infertility, female   ? Iron deficiency anemia   ? Lower extremity edema   ? Over weight   ? 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 Polycystic ovarian syndrome   ? Psoriasis   ? Sleep apnea   ? SOB (shortness of breath)   ? Syncope due to orthostatic hypotension 09/17/2019  ? Vitamin D deficiency   ? ? ? ?Past Surgical History:  ?Procedure Laterality Date  ? ABLATION ON ENDOMETRIOSIS  2019  ? APPENDECTOMY    ? as a child. open procedure  ? LAPAROSCOPIC GASTRIC BYPASS  2017  ? roux en y  ? LAPAROSCOPIC LYSIS OF ADHESIONS  02/17/2020  ? Procedure: Laparoscopic Lysis Of Adhesions;  Surgeon: Aletha Halim, MD;  Location: [facility];  Service: Gynecology;;  ? LAPAROSCOPIC UNILATERAL SALPINGO OOPHERECTOMY Right 02/17/2020  ? Procedure: LAPAROSCOPIC RIGHT SALPINGO OOPHORECTOMY WITH REMOVAL OF ECTOPIC PREGNANCY;  Surgeon: Aletha Halim, MD;  Location: [facility];  Service: Gynecology;  Laterality: Right;  ? TONSILLECTOMY    ? WISDOM TOOTH  EXTRACTION    ? ? ? ?Social History  ? ?Tobacco Use  ?Smoking Status Former  ? Types: Cigarettes  ?Smokeless Tobacco Never  ? ? ?Social History  ? ?Substance and Sexual Activity  ?Alcohol Use Not Currently  ? ? ? ?Family History  ?Problem Relation Age of Onset  ? Diabetes Mother   ? Anxiety disorder Mother   ? Obesity Mother   ? Cancer Father   ? Multiple myeloma Father   ? Colon cancer Father   ? Diabetes Father   ? Stroke Father   ? Aneurysm Father   ? Depression Father   ? Schizophrenia Father   ? Liver disease Father   ? Diabetes Maternal Grandmother   ? Stroke Paternal Grandmother   ? Breast cancer Paternal Grandmother   ? Cancer Paternal Grandmother   ?     breast  ? Colon cancer Paternal Grandfather   ? ? ? ? ?Current Outpatient Medications:  ?  Calcium Citrate-Vitamin D 500-500 MG-UNT/5GM POWD, Take by mouth., Disp: , Rfl:  ?  Cyanocobalamin (VITAMIN B12 PO), Take 1 tablet by mouth daily., Disp: , Rfl:  ?  Folic Acid 5 MG CAPS, , Disp: , Rfl:  ?  lisdexamfetamine (VYVANSE) 50 MG capsule, Take 1 capsule (50 mg total) by mouth daily before breakfast., Disp: 30 capsule, Rfl: 0 ?  [START ON 01/13/2022]  lisdexamfetamine (VYVANSE) 50 MG capsule, Take 1 capsule (50 mg total) by mouth daily before breakfast., Disp: 30 capsule, Rfl: 0 ?  lisdexamfetamine (VYVANSE) 50 MG capsule, Take 1 capsule (50 mg total) by mouth daily before breakfast., Disp: 30 capsule, Rfl: 0 ?  multivitamin (VIT W/EXTRA C) CHEW chewable tablet, Chew 2 tablets by mouth daily., Disp: , Rfl:  ?  Pediatric Multivitamins-Iron (FLINTSTONES COMPLETE PO), Take 2 tablets by mouth daily., Disp: , Rfl:  ? ? ?Cardiovascular and other pertinent studies: ? ?Echocardiogram 09/17/2019: ? 1. Left ventricular ejection fraction, by visual estimation, is 60 to  ?65%. The left ventricle has normal function. There is no left ventricular  ?hypertrophy.  ? 2. The left ventricle has no regional wall motion abnormalities.  ? 3. Global right ventricle has normal systolic  function.The right  ?ventricular size is normal. No increase in right ventricular wall  ?thickness.  ? 4. Left atrial size was normal.  ? 5. Right atrial size was normal.  ? 6. The mitral valve is normal in structure. No evidence of mitral valve  ?regurgitation. No evidence of mitral stenosis.  ? 7. The tricuspid valve is normal in structure.  ? 8. The aortic valve is normal in structure. Aortic valve regurgitation is  ?not visualized. No evidence of aortic valve sclerosis or stenosis.  ? 9. The pulmonic valve was normal in structure. Pulmonic valve  ?regurgitation is not visualized.  ?10. Normal pulmonary artery systolic pressure.  ?11. The inferior vena cava is dilated in size with >50% respiratory  ?variability, suggesting right atrial pressure of 8 mmHg.  ? ?Reviewed external labs and tests, independently interpreted ? ?EKG 01/03/2022: ?Sinus rhythm 81 bpm ?Low voltage in precordial leads ? ? ?Recent labs: ?Not available ? ? ?Review of Systems  ?Cardiovascular:  Negative for chest pain, dyspnea on exertion, leg swelling, palpitations and syncope.  ? ?   ? ? ?Vitals:  ? 01/03/22 1240  ?BP: 115/74  ?Pulse: 80  ?Resp: 16  ?Temp: 98 ?F (36.7 ?C)  ?SpO2: 100%  ? ? ? ?Body mass index is 28.67 kg/m?. ?Filed Weights  ? 01/03/22 1240  ?Weight: 167 lb (75.8 kg)  ? ? ? ?Objective:  ? Physical Exam ?Vitals and nursing note reviewed.  ?Constitutional:   ?   General: She is not in acute distress. ?Neck:  ?   Vascular: No JVD.  ?Cardiovascular:  ?   Rate and Rhythm: Normal rate and regular rhythm.  ?   Heart sounds: Normal heart sounds. No murmur heard. ?Pulmonary:  ?   Effort: Pulmonary effort is normal.  ?   Breath sounds: Normal breath sounds. No wheezing or rales.  ?Musculoskeletal:  ?   Right lower leg: No edema.  ?   Left lower leg: No edema.  ? ? ? ?   ? ?Visit diagnoses: ?  ICD-10-CM   ?1. Heart murmur  R01.1 EKG 12-Lead  ?  ?  ? ? ?Assessment & Recommendations:  ? ? ?41 y.o. African American female with obesity, h/o  gastric bypass surgery, multinodular goiter, asthma, referred for evaluation of murmur ? ?Evaluated previous echocardiogram.  No significant valvular abnormality noted.  No significant murmur on my exam today.  Reassured the patient.  Continue liberal hydration. ? ?I will see her on as-needed basis. ? ? ?Thank you for referring the patient to [place]. Please feel free to contact with any questions. ? ? ?Nigel Mormon, MD ?Pager: [phone] ?Office: [phone] ?

## 2022-02-19 IMAGING — MG MM DIGITAL SCREENING BILAT W/ TOMO AND CAD
8 series · 8 of 24 positions shown · non-contrast
Comparison: None.

CLINICAL DATA: Screening.

EXAM:
DIGITAL SCREENING BILATERAL MAMMOGRAM WITH TOMOSYNTHESIS AND CAD
TECHNIQUE: Bilateral screening digital craniocaudal and mediolateral oblique
mammograms were obtained. Bilateral screening digital breast
tomosynthesis was performed. The images were evaluated with
computer-aided detection.

[L CC synth-2D]
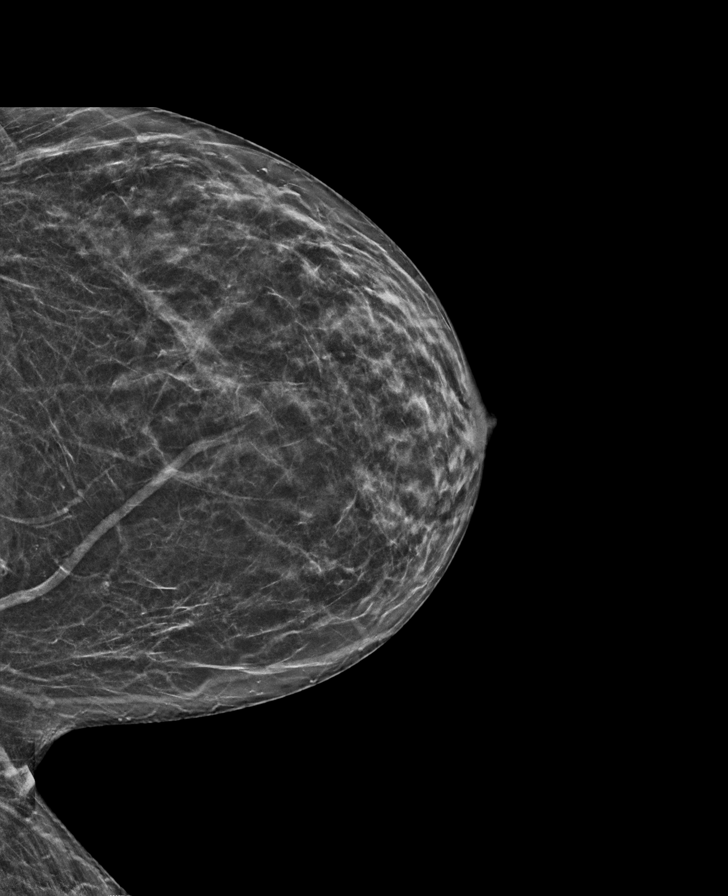

[R CC synth-2D]
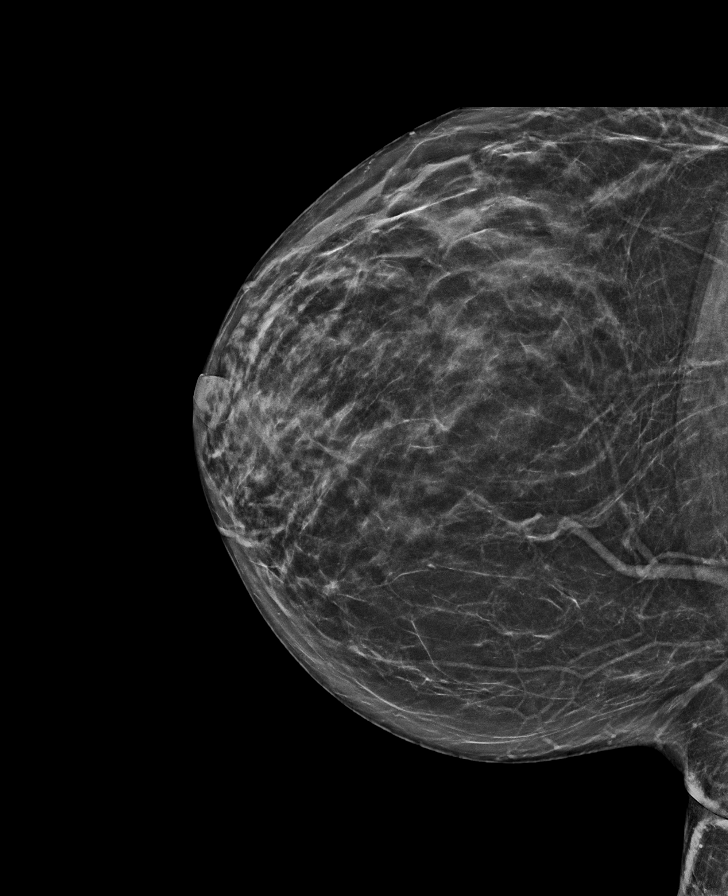

[L MLO synth-2D]
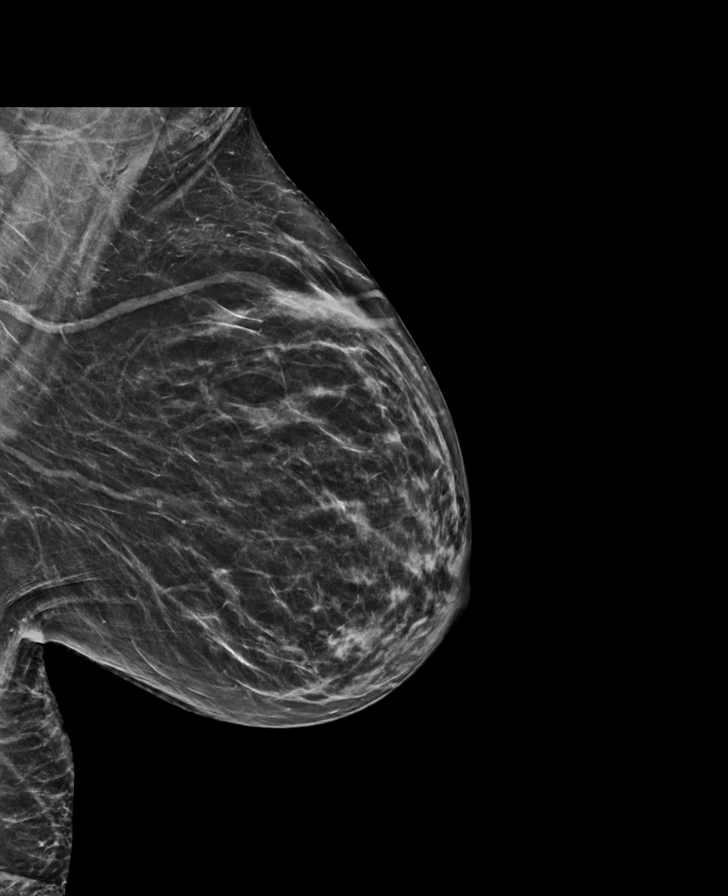

[R MLO synth-2D]
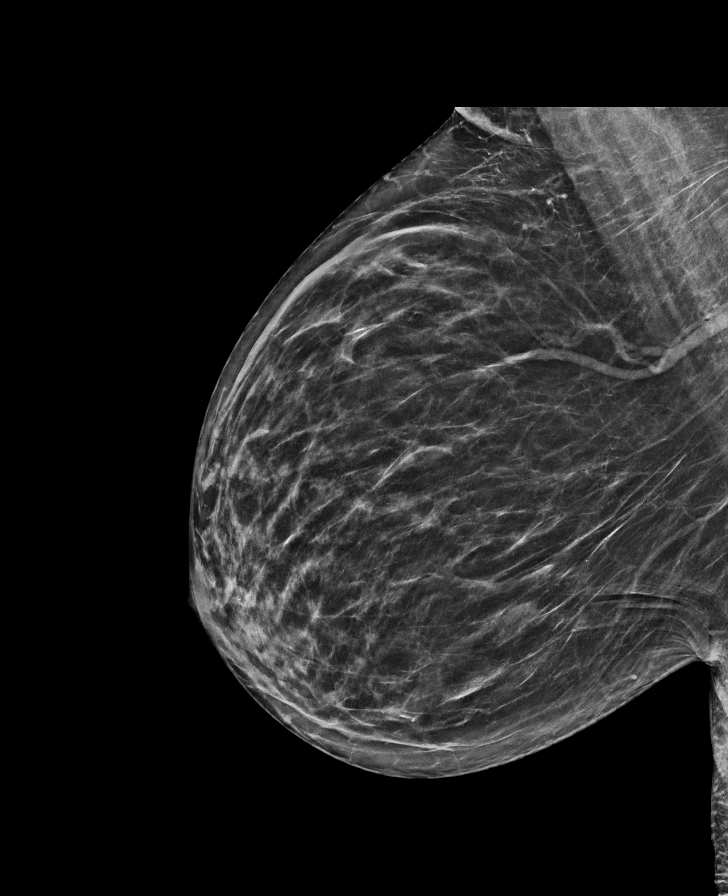

[L CC tomo · tomo slice 29/58.0]
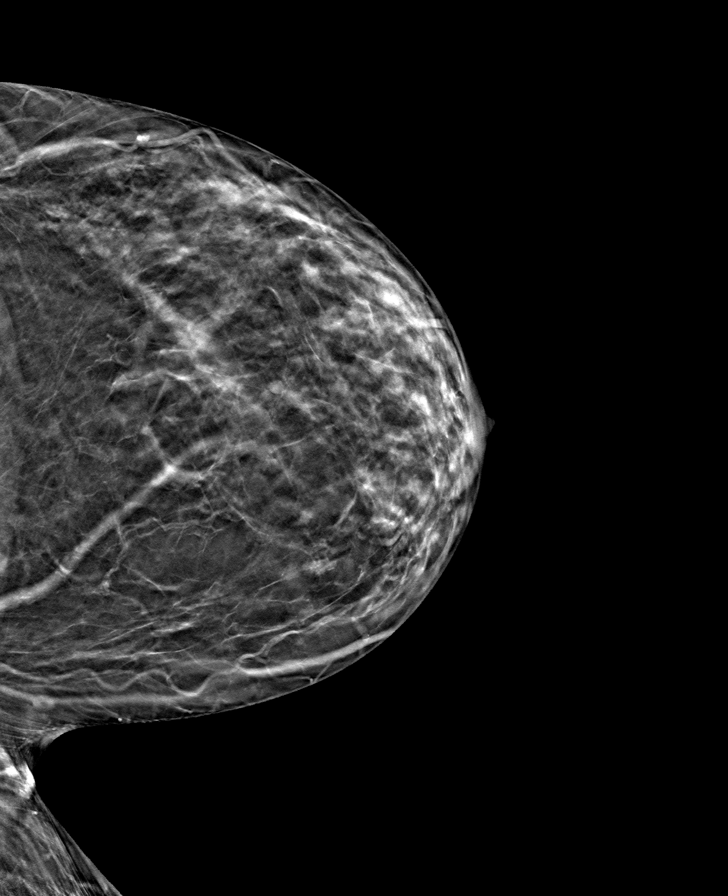

[R MLO tomo · tomo slice 36/71.0]
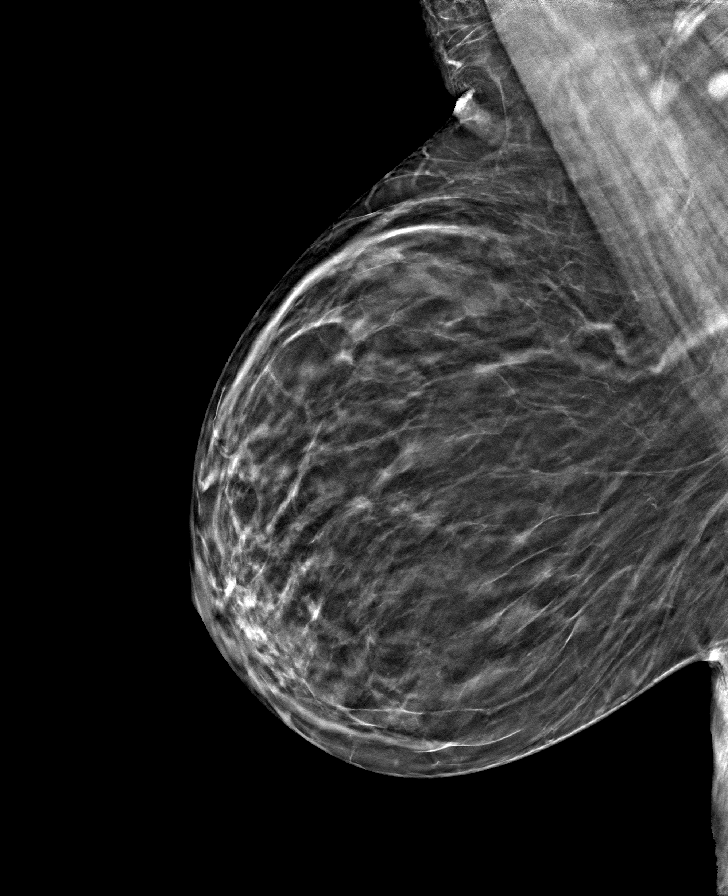

[L MLO tomo · tomo slice 35/69.0]
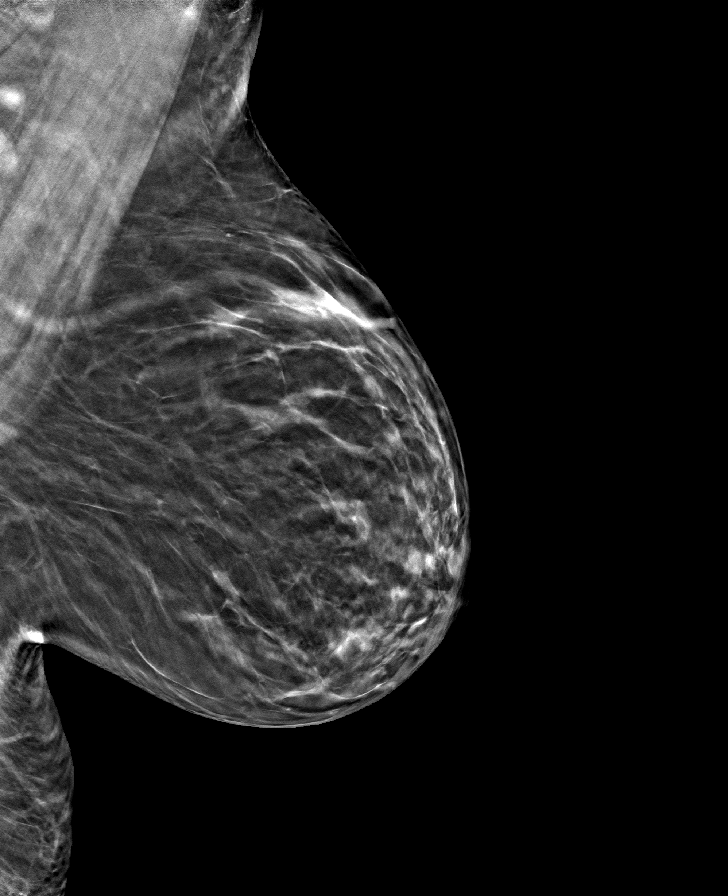

[R CC tomo · tomo slice 32/63.0]
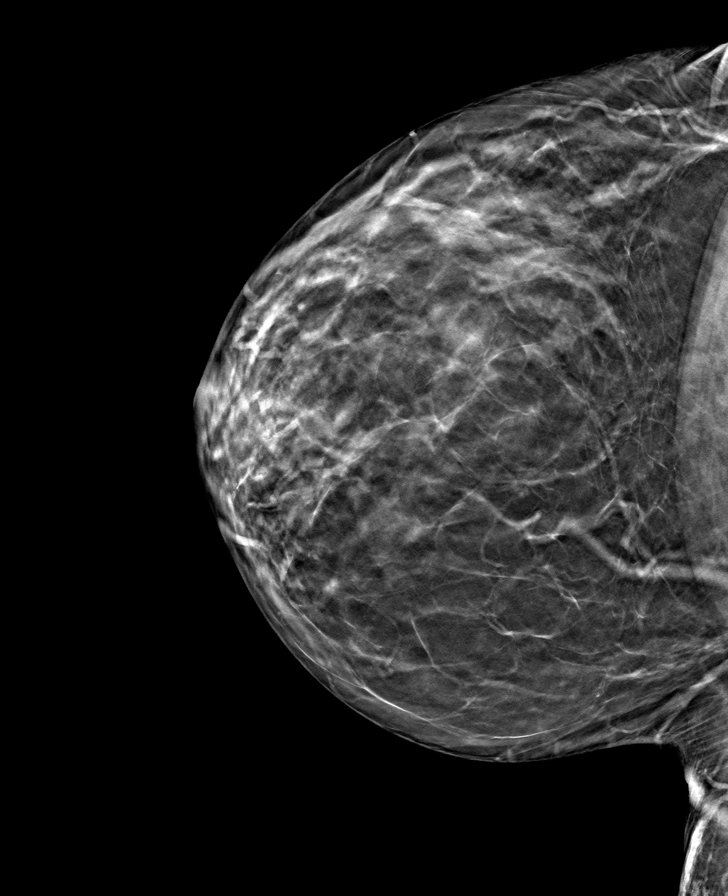

[8 of 24 positions shown; findings below may reference images not displayed]

ACR Breast Density Category b: There are scattered areas of
fibroglandular density.
FINDINGS: There are no findings suspicious for malignancy.
IMPRESSION: No mammographic evidence of malignancy. A result letter of this
screening mammogram will be mailed directly to the patient.

RECOMMENDATION:
Screening mammogram in one year. (Code:XG-X-X7B)

BI-RADS CATEGORY  1: Negative.

## 2022-04-10 ENCOUNTER — Other Ambulatory Visit: Payer: Self-pay

## 2022-04-10 ENCOUNTER — Emergency Department (HOSPITAL_COMMUNITY)
Admission: EM | Admit: 2022-04-10 | Discharge: 2022-04-11 | Discharge status: Against Medical Advice | Payer: Medicaid Other | Attending: Emergency Medicine | Admitting: Emergency Medicine

## 2022-04-10 ENCOUNTER — Emergency Department (HOSPITAL_COMMUNITY): Payer: Medicaid Other

## 2022-04-10 DIAGNOSIS — R102 Pelvic and perineal pain: Secondary | ICD-10-CM | POA: Diagnosis present

## 2022-04-10 DIAGNOSIS — N938 Other specified abnormal uterine and vaginal bleeding: Secondary | ICD-10-CM | POA: Insufficient documentation

## 2022-04-10 DIAGNOSIS — Z5321 Procedure and treatment not carried out due to patient leaving prior to being seen by health care provider: Secondary | ICD-10-CM | POA: Insufficient documentation

## 2022-04-10 LAB — CBC WITH DIFFERENTIAL/PLATELET
Abs Immature Granulocytes: 0.04 10*3/uL (ref 0.00–0.07)
Basophils Absolute: 0.1 10*3/uL (ref 0.0–0.1)
Basophils Relative: 1 %
Eosinophils Absolute: 0.1 10*3/uL (ref 0.0–0.5)
Eosinophils Relative: 1 %
HCT: 42.5 % (ref 36.0–46.0)
Hemoglobin: 14.1 g/dL (ref 12.0–15.0)
Immature Granulocytes: 0 %
Lymphocytes Relative: 34 %
Lymphs Abs: 3.2 10*3/uL (ref 0.7–4.0)
MCH: 31.6 pg (ref 26.0–34.0)
MCHC: 33.2 g/dL (ref 30.0–36.0)
MCV: 95.3 fL (ref 80.0–100.0)
Monocytes Absolute: 1.1 10*3/uL — ABNORMAL HIGH (ref 0.1–1.0)
Monocytes Relative: 12 %
Neutro Abs: 4.9 10*3/uL (ref 1.7–7.7)
Neutrophils Relative %: 52 %
Platelets: 280 10*3/uL (ref 150–400)
RBC: 4.46 MIL/uL (ref 3.87–5.11)
RDW: 12.7 % (ref 11.5–15.5)
WBC: 9.4 10*3/uL (ref 4.0–10.5)
nRBC: 0 % (ref 0.0–0.2)

## 2022-04-10 LAB — URINALYSIS, ROUTINE W REFLEX MICROSCOPIC
Bilirubin Urine: NEGATIVE
Glucose, UA: NEGATIVE mg/dL
Ketones, ur: NEGATIVE mg/dL
Nitrite: NEGATIVE
Protein, ur: NEGATIVE mg/dL
Specific Gravity, Urine: 1.027 (ref 1.005–1.030)
pH: 5 (ref 5.0–8.0)

## 2022-04-10 LAB — COMPREHENSIVE METABOLIC PANEL
ALT: 17 U/L (ref 0–44)
AST: 29 U/L (ref 15–41)
Albumin: 3.1 g/dL — ABNORMAL LOW (ref 3.5–5.0)
Alkaline Phosphatase: 37 U/L — ABNORMAL LOW (ref 38–126)
Anion gap: 6 (ref 5–15)
BUN: 21 mg/dL — ABNORMAL HIGH (ref 6–20)
CO2: 26 mmol/L (ref 22–32)
Calcium: 8.3 mg/dL — ABNORMAL LOW (ref 8.9–10.3)
Chloride: 108 mmol/L (ref 98–111)
Creatinine, Ser: 0.59 mg/dL (ref 0.44–1.00)
GFR, Estimated: >60 mL/min (ref >60)
Glucose, Bld: 56 mg/dL — ABNORMAL LOW (ref 70–99)
Potassium: 3.1 mmol/L — ABNORMAL LOW (ref 3.5–5.1)
Sodium: 140 mmol/L (ref 135–145)
Total Bilirubin: 0.7 mg/dL (ref 0.3–1.2)
Total Protein: 5.2 g/dL — ABNORMAL LOW (ref 6.5–8.1)

## 2022-04-10 LAB — PREGNANCY, URINE: Preg Test, Ur: NEGATIVE

## 2022-04-10 LAB — LIPASE, BLOOD: Lipase: 33 U/L (ref 11–51)

## 2022-04-10 NOTE — ED Triage Notes (Signed)
Pt here from home for lower abd cramping that started at approx 1130 today w/ subsequent sudden onset heavy vaginal bleeding. Per pt she has been spotting x 2 days. Hx endometriosis, PCOS, has had R fallopian tube & R ovary removed. Reports nausea.

## 2022-04-10 NOTE — ED Provider Triage Note (Signed)
Emergency Medicine Provider Triage Evaluation Note  Stacey Ramos , a 41 y.o. female  was evaluated in triage.  Pt complains of pelvic pain.  Onset earlier today.  Patient reports she is on the third day of her menstrual cycle.  She has history of endometriosis.  Has had multiple abdominal surgeries including appendectomy.  States she typically has mild abdominal cramping with her menstrual cycle but nothing this severe.  Denies flank pain, dysuria, vaginal discharge.  Reports slightly heavier bleeding than her normal menstrual cycle but nothing significant.  Denies fever.  Review of Systems  Positive: As above Negative: As above  Physical Exam  BP 100/64   Pulse 87   Temp 98.7 F (37.1 C) (Oral)   Resp 16   SpO2 99%  Gen:   Awake, no distress   Resp:  Normal effort  MSK:   Moves extremities without difficulty  Other:    Medical Decision Making  Medically screening exam initiated at 4:37 PM.  Appropriate orders placed.  Nadeen Dwiggins was informed that the remainder of the evaluation will be completed by another provider, this initial triage assessment does not replace that evaluation, and the importance of remaining in the ED until their evaluation is complete.     Evlyn Courier, PA-C 04/10/22 [phone]

## 2022-04-11 NOTE — ED Notes (Signed)
Pt did not response to check in

## 2022-04-30 ENCOUNTER — Encounter (INDEPENDENT_AMBULATORY_CARE_PROVIDER_SITE_OTHER): Payer: Self-pay

## 2022-06-26 ENCOUNTER — Other Ambulatory Visit (HOSPITAL_COMMUNITY)
Admission: RE | Admit: 2022-06-26 | Discharge: 2022-06-26 | Disposition: A | Discharge status: Home / Self Care | Payer: Medicaid Other | Source: Ambulatory Visit | Attending: Advanced Practice Midwife | Admitting: Advanced Practice Midwife

## 2022-06-26 ENCOUNTER — Ambulatory Visit: Payer: Medicaid Other | Admitting: Advanced Practice Midwife

## 2022-06-26 ENCOUNTER — Encounter: Payer: Self-pay | Admitting: Advanced Practice Midwife

## 2022-06-26 VITALS — BP 114/71 | HR 73 | Wt 161.0 lb

## 2022-06-26 DIAGNOSIS — R87810 Cervical high risk human papillomavirus (HPV) DNA test positive: Secondary | ICD-10-CM

## 2022-06-26 DIAGNOSIS — R87610 Atypical squamous cells of undetermined significance on cytologic smear of cervix (ASC-US): Secondary | ICD-10-CM

## 2022-06-26 DIAGNOSIS — Z9189 Other specified personal risk factors, not elsewhere classified: Secondary | ICD-10-CM

## 2022-06-26 DIAGNOSIS — G8929 Other chronic pain: Secondary | ICD-10-CM

## 2022-06-26 DIAGNOSIS — N939 Abnormal uterine and vaginal bleeding, unspecified: Secondary | ICD-10-CM | POA: Diagnosis present

## 2022-06-26 DIAGNOSIS — N809 Endometriosis, unspecified: Secondary | ICD-10-CM

## 2022-06-26 DIAGNOSIS — Z9889 Other specified postprocedural states: Secondary | ICD-10-CM

## 2022-06-26 DIAGNOSIS — N736 Female pelvic peritoneal adhesions (postinfective): Secondary | ICD-10-CM

## 2022-06-26 DIAGNOSIS — M5440 Lumbago with sciatica, unspecified side: Secondary | ICD-10-CM

## 2022-06-26 LAB — POCT URINALYSIS DIPSTICK
Bilirubin, UA: NEGATIVE
Blood, UA: NEGATIVE
Glucose, UA: NEGATIVE
Ketones, UA: NEGATIVE
Leukocytes, UA: NEGATIVE
Nitrite, UA: NEGATIVE
Protein, UA: NEGATIVE
Spec Grav, UA: 1.015 (ref 1.010–1.025)
Urobilinogen, UA: 0.2 E.U./dL
pH, UA: 7 (ref 5.0–8.0)

## 2022-06-26 LAB — POCT URINE PREGNANCY: Preg Test, Ur: NEGATIVE

## 2022-06-26 NOTE — Progress Notes (Signed)
Patient presents for STD Screening today  CC: Pelvic pain cramps have been 10+/10 pain. No relief with OTC medication. Vaginal discharge. Would like testing for BV and yeast as well.   Lower back pain , spotting. Denies any dysuria, no burning.   LMP 09/14-09/21 then spotting 9/22-09/26.  Pt went to ER back in July for pain yet due to waiting over 8 hrs she left w/o being seen.

## 2022-06-26 NOTE — Progress Notes (Signed)
GYNECOLOGY PROBLEM CARE ENCOUNTER NOTE  History:     Stacey Ramos is a 41 y.o. G52P1011 female here for repeat pap s/p Colpo 07/2021. She also has multiple other complaints she would like to discuss including spotting after her menstrual cycle, possible STD exposure via unprotected sex and recurrent back pain.  Denies abnormal vaginal bleeding, discharge, pelvic pain, problems with intercourse or other gynecologic concerns.    Gynecologic History No LMP recorded. (Menstrual status: Irregular Periods). Contraception:  N/A (hx of PCOS, Endometriosis)  Last Pap: 07/04/2021 (ASCUS +HRHPV) S/p Colposcopy 07/29/2021 (CIN 1).  Last Mammogram: 07/2021.  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   LAPAROSCOPIC LYSIS OF ADHESIONS  02/17/2020   Procedure: Laparoscopic Lysis Of Adhesions;  Surgeon: PAletha Halim MD;  [facility] [facility]  Service: Gynecology;;   LAPAROSCOPIC UNILATERAL SALPINGO OOPHERECTOMY Right 02/17/2020   Procedure: LAPAROSCOPIC RIGHT  SALPINGO OOPHORECTOMY WITH REMOVAL OF ECTOPIC PREGNANCY;  Surgeon: PAletha Halim MD;  [place] [facility]  Service: Gynecology;  Laterality: Right;   TONSILLECTOMY     WISDOM TOOTH EXTRACTION      Current Outpatient Medications on File Prior to Visit  Medication Sig Dispense Refill   Calcium Citrate-Vitamin D 500-500 MG-UNT/5GM POWD Take by mouth.     Cyanocobalamin (VITAMIN B12 PO) Take 1 tablet by mouth daily.     Folic Acid 5 MG CAPS      multivitamin (VIT W/EXTRA C) CHEW chewable tablet Chew 2 tablets by mouth daily.     Pediatric Multivitamins-Iron (FLINTSTONES COMPLETE PO) Take 2 tablets by mouth daily.     lisdexamfetamine (VYVANSE) 50 MG capsule Take 1 capsule (50 mg total) by mouth daily before breakfast. 30 capsule 0   lisdexamfetamine (VYVANSE) 50 MG capsule Take 1 capsule (50 mg total) by mouth daily before breakfast. 30 capsule 0   lisdexamfetamine (VYVANSE) 50 MG capsule Take 1 capsule (50 mg total) by mouth daily before breakfast. 30 capsule 0   Vitamin D, Ergocalciferol, (DRISDOL) 1.25 MG (50000 UNIT) CAPS capsule Take 50,000 Units by mouth once a week.     No current facility-administered medications on file prior to visit.  No Known Allergies  Social History:  reports that she quit smoking about 33 years ago. Her smoking use included cigarettes. She has never used smokeless tobacco. She reports current alcohol use. She reports current drug use. Drug: Marijuana.  Family History  Problem Relation Age of Onset   Diabetes Mother    Anxiety disorder Mother    Obesity Mother    Cancer Father    Multiple myeloma Father    Colon cancer Father    Diabetes Father    Stroke Father    Aneurysm Father    Depression Father    Schizophrenia Father    Liver disease Father    Diabetes Maternal Grandmother    Stroke Paternal Grandmother    Breast cancer Paternal Grandmother    Cancer Paternal Grandmother        breast   Colon cancer Paternal Grandfather     The  following portions of the patient's history were reviewed and updated as appropriate: allergies, current medications, past family history, past medical history, past social history, past surgical history and problem list.  Review of Systems Pertinent items noted in HPI and remainder of comprehensive ROS otherwise negative.  Physical Exam:  BP 114/71   Pulse 73   Wt 161 lb (73 kg)   BMI 27.64 kg/m  CONSTITUTIONAL: Well-developed, well-nourished female in no acute distress.  HENT:  Normocephalic, atraumatic, External right and left ear normal.  EYES: Conjunctivae and EOM are normal. Pupils are equal, round, and reactive to light. No scleral icterus.  SKIN: Skin is warm and dry. No rash noted. Not diaphoretic. No erythema. No pallor. MUSCULOSKELETAL: Normal range of motion. No tenderness.  No cyanosis, clubbing, or edema. NEUROLOGIC: Alert and oriented to person, place, and time. Normal reflexes, muscle tone coordination.  PSYCHIATRIC: Normal mood and affect. Normal behavior. Normal judgment and thought content. CARDIOVASCULAR: Normal heart rate noted, regular rhythm RESPIRATORY: Clear to auscultation bilaterally. Effort and breath sounds normal, no problems with respiration noted. BREASTS: Symmetric in size. No masses, tenderness, skin changes, nipple drainage, or lymphadenopathy bilaterally. Performed in the presence of a chaperone. ABDOMEN: Soft, no distention noted.  No tenderness, rebound or guarding.  PELVIC: Normal appearing external genitalia and urethral meatus; normal appearing vaginal mucosa and cervix.  No abnormal vaginal discharge noted.  Pap smear obtained.   Performed in the presence of a chaperone.   Assessment and Plan:    1. Vaginal spotting - Recurrent, hx of AUB, possible perimenopause - Cervicovaginal ancillary only( [facility]) - Hepatitis B surface antigen - Hepatitis C antibody - RPR - HIV Antibody (routine testing w rflx) - POCT urine pregnancy - Urine  Culture - Cytology - PAP - POCT Urinalysis Dipstick  2. ASCUS with positive high risk HPV on pap smear 07/04/2021   3. Hx of colposcopy with cervical biopsy - Repeat pap today per ASCCP guidelines and office notes from Dr. Harolyn Rutherford s/p Colpo 07/2021  4. At risk for sexually transmitted disease due to unprotected sex   82. Pelvic adhesive disease   6. Endometriosis - Previous negative experience with IUD migration requiring surgical intervention  - Per notes from Dr. Harolyn Rutherford from 2022, patient may schedule return appointment with MDs for exploration of minimally invasive surgery, other options for management  7. Chronic bilateral low back pain with sciatica, sciatica laterality unspecified - No TTP at paraspinals, Latissimus Dorsi insertion or SI margin - No CVAT or urinary symptoms at this time - Has PCP - Per patient, attributed to prolonged  travel by car for multiple jobs - Consider referral to Physical Therapy   Will follow up results of pap smear and manage accordingly. Mammogram scheduled Routine preventative health maintenance measures emphasized. Please refer to After Visit Summary for other counseling recommendations.      Mallie Snooks, [place], MSN, CNM Certified Nurse Midwife, Product/process development scientist for [employer], [place]

## 2022-06-27 LAB — CERVICOVAGINAL ANCILLARY ONLY
Bacterial Vaginitis (gardnerella): POSITIVE — AB
Candida Glabrata: NEGATIVE
Candida Vaginitis: NEGATIVE
Chlamydia: NEGATIVE
Comment: NEGATIVE
Comment: NEGATIVE
Comment: NEGATIVE
Comment: NEGATIVE
Comment: NEGATIVE
Comment: NORMAL
Neisseria Gonorrhea: NEGATIVE
Trichomonas: NEGATIVE

## 2022-06-27 LAB — RPR: RPR Ser Ql: NONREACTIVE

## 2022-06-27 LAB — HIV ANTIBODY (ROUTINE TESTING W REFLEX): HIV Screen 4th Generation wRfx: NONREACTIVE

## 2022-06-27 LAB — HEPATITIS C ANTIBODY: Hep C Virus Ab: NONREACTIVE

## 2022-06-27 LAB — HEPATITIS B SURFACE ANTIGEN: Hepatitis B Surface Ag: NEGATIVE

## 2022-06-27 MED ORDER — METRONIDAZOLE 500 MG PO TABS: 500.0000 mg | ORAL_TABLET | Freq: Two times a day (BID) | ORAL | 0 refills | Status: DC

## 2022-06-29 LAB — URINE CULTURE

## 2022-07-01 LAB — CYTOLOGY - PAP
Comment: NEGATIVE
Diagnosis: NEGATIVE
High risk HPV: NEGATIVE

## 2022-08-20 ENCOUNTER — Ambulatory Visit: Payer: Medicaid Other | Admitting: Family Medicine

## 2022-08-20 ENCOUNTER — Encounter: Payer: Self-pay | Admitting: Family Medicine

## 2022-08-20 VITALS — BP 125/77 | HR 69 | Wt 169.0 lb

## 2022-08-20 DIAGNOSIS — N809 Endometriosis, unspecified: Secondary | ICD-10-CM | POA: Diagnosis not present

## 2022-08-20 MED ORDER — NORETHINDRONE ACETATE 5 MG PO TABS: 5.0000 mg | ORAL_TABLET | Freq: Every day | ORAL | 1 refills | Status: AC

## 2022-08-20 NOTE — Progress Notes (Signed)
    Subjective:    Patient ID: Stacey Ramos is a 41 y.o. female presenting with Consult  on 08/20/2022  HPI: Patent is here to discuss treatment options for her endometriosis. Has h/o PCOS and endometriosis. She reports painful cycles and heavy cycles. Has h/o ectopic and RSO with Dr. Ilda Basset. He stated her left ovary was stuck to her tube on the left.  09/16/2018 had laparoscopy with finding of stage 1 endometriosis and ablation of endometriotic implants in [place]. Is a [id number] with SVD x 1. She has also had a lap gastric bypass and her cycles are much more regular but remain heavy and painful. Got pregnant on OTC. IUD migrated and does not want that again. She is quite nervous about possibly losing her other ovary with any surgery. She  Review of Systems  Constitutional:  Negative for chills and fever.  Respiratory:  Negative for shortness of breath.   Cardiovascular:  Negative for chest pain.  Gastrointestinal:  Negative for abdominal pain, nausea and vomiting.  Genitourinary:  Negative for dysuria.  Skin:  Negative for rash.      Objective:    BP 125/77   Pulse 69   Wt 169 lb (76.7 kg)   LMP 08/02/2022   BMI 29.01 kg/m  Physical Exam Exam conducted with a chaperone present.  Constitutional:      General: She is not in acute distress.    Appearance: She is well-developed.  HENT:     Head: Normocephalic and atraumatic.  Eyes:     General: No scleral icterus. Cardiovascular:     Rate and Rhythm: Normal rate.  Pulmonary:     Effort: Pulmonary effort is normal.  Abdominal:     Palpations: Abdomen is soft.  Musculoskeletal:     Cervical back: Neck supple.  Skin:    General: Skin is warm and dry.  Neurological:     Mental Status: She is alert and oriented to person, place, and time.         Assessment & Plan:   Problem List Items Addressed This Visit       Unprioritized   Endometriosis - Primary    Patient continues to desire fertility. Discussed age and  pathology. Possible REI referral, but has Medicaid only and no way of paying for this. Discussed other treatment options to include IUD, depo (does not want anything to make her gain weight), birth control, but does not want to not conceive. Discussed surgical options and consequences of losing her remaining ovary, possible clean up vs. Clean out. She is concerned about further surgery and risks of scar tissue, as she should be. Cannot really control pain as NSAIDS are out due to Gastric bypass. Will trial Aygestin with heavy cycles only. Heat for pain and tylenol.      Relevant Medications   norethindrone (AYGESTIN) 5 MG tablet     Return in about 3 months (around 11/20/2022).  Stacey Jude, MD 08/20/2022 11:29 AM

## 2022-08-20 NOTE — Assessment & Plan Note (Signed)
Patient continues to desire fertility. Discussed age and pathology. Possible REI referral, but has Medicaid only and no way of paying for this. Discussed other treatment options to include IUD, depo (does not want anything to make her gain weight), birth control, but does not want to not conceive. Discussed surgical options and consequences of losing her remaining ovary, possible clean up vs. Clean out. She is concerned about further surgery and risks of scar tissue, as she should be. Cannot really control pain as NSAIDS are out due to Gastric bypass. Will trial Aygestin with heavy cycles only. Heat for pain and tylenol.

## 2022-08-20 NOTE — Progress Notes (Signed)
  RGYN pt notes cycles have been irregular.   May come early or late and spotting in between periods.   Pt saw Dr.A 11/22. Pt states surgery was briefly discussed.

## 2022-12-01 ENCOUNTER — Inpatient Hospital Stay (HOSPITAL_COMMUNITY)
Admission: AD | Admit: 2022-12-01 | Discharge: 2022-12-01 | Disposition: A | Discharge status: Home / Self Care | Payer: Medicaid Other | Attending: Obstetrics & Gynecology | Admitting: Obstetrics & Gynecology

## 2022-12-01 DIAGNOSIS — Z3202 Encounter for pregnancy test, result negative: Secondary | ICD-10-CM | POA: Insufficient documentation

## 2022-12-01 DIAGNOSIS — R109 Unspecified abdominal pain: Secondary | ICD-10-CM | POA: Insufficient documentation

## 2022-12-01 DIAGNOSIS — R1084 Generalized abdominal pain: Secondary | ICD-10-CM | POA: Diagnosis not present

## 2022-12-01 LAB — POCT PREGNANCY, URINE: Preg Test, Ur: NEGATIVE

## 2022-12-01 LAB — HCG, QUANTITATIVE, PREGNANCY: hCG, Beta Chain, Quant, S: <1 m[IU]/mL (ref <5)

## 2022-12-01 NOTE — MAU Provider Note (Signed)
Event Date/Time   First Provider Initiated Contact with Patient 12/01/22 1956      S Ms. Stacey Ramos is a 42 y.o. G2P1011 patient who presents to MAU today with complaint of abdominal pain. She reports it is the same pain she had when she had an ectopic pregnancy. She denies any bleeding   O BP 119/66 (BP Location: Right Arm)   Pulse 81   Temp 98.9 F (37.2 C)   Resp 18   Ht '5\' 4"'$  (1.626 m)   Wt 81.2 kg   SpO2 100%   BMI 30.74 kg/m  Physical Exam Vitals and nursing note reviewed.  Constitutional:      General: She is not in acute distress.    Appearance: She is well-developed.  HENT:     Head: Normocephalic.  Eyes:     Pupils: Pupils are equal, round, and reactive to light.  Cardiovascular:     Rate and Rhythm: Normal rate and regular rhythm.     Heart sounds: Normal heart sounds.  Pulmonary:     Effort: Pulmonary effort is normal. No respiratory distress.     Breath sounds: Normal breath sounds.  Abdominal:     General: Bowel sounds are normal. There is no distension.     Palpations: Abdomen is soft.     Tenderness: There is no abdominal tenderness.  Skin:    General: Skin is warm and dry.  Neurological:     Mental Status: She is alert and oriented to person, place, and time.  Psychiatric:        Mood and Affect: Mood normal.        Behavior: Behavior normal.        Thought Content: Thought content normal.        Judgment: Judgment normal.     A Medical screening exam complete 1. Negative pregnancy test   2. Generalized abdominal pain    Results for orders placed or performed during the hospital encounter of 12/01/22 (from the past 24 hour(s))  Pregnancy, urine POC     Status: None   Collection Time: 12/01/22  7:35 PM  Result Value Ref Range   Preg Test, Ur NEGATIVE NEGATIVE  hCG, quantitative, pregnancy     Status: None   Collection Time: 12/01/22  8:12 PM  Result Value Ref Range   hCG, Beta Chain, Quant, S <1 <5 mIU/mL     P Discharge from  MAU in stable condition Patient given the option of transfer to [facility] for further evaluation or seek care in outpatient facility of choice  List of options for follow-up given  Warning signs for worsening condition that would warrant emergency follow-up discussed Patient may return to MAU as needed   Wende Mott, [place] 12/01/2022 7:56 PM

## 2022-12-01 NOTE — MAU Note (Signed)
.  Stacey Ramos is a 42 y.o. at Unknown here in MAU reporting: Pt had a neg preg home test, but she is concerned about an ectopic pregnancy because her period have been different symptoms and not regular as her normal, with bloating, cramping on her left lower ABD. Pt states she only has one tube on her left side due to a tubal removal post ectopic pregnancy. No VB currently, just two days of bleeding during her last period.   Onset of complaint: ongoing  Pain score: 4/10 Vitals:   12/01/22 1939  BP: 119/66  Pulse: 81  Resp: 18  Temp: 98.9 F (37.2 C)  SpO2: 100%      Lab orders placed from triage:  POCT UPT

## 2022-12-04 ENCOUNTER — Encounter: Payer: Self-pay | Admitting: Family Medicine

## 2022-12-04 ENCOUNTER — Ambulatory Visit: Payer: Medicaid Other | Admitting: Family Medicine

## 2022-12-04 NOTE — Progress Notes (Signed)
Patient did not keep appointment today. She may call to reschedule.  

## 2022-12-09 ENCOUNTER — Other Ambulatory Visit: Payer: Self-pay | Admitting: Family Medicine

## 2022-12-09 DIAGNOSIS — E042 Nontoxic multinodular goiter: Secondary | ICD-10-CM

## 2023-06-14 ENCOUNTER — Emergency Department (HOSPITAL_COMMUNITY)
Admission: EM | Admit: 2023-06-14 | Discharge: 2023-06-14 | Disposition: A | Discharge status: Home / Self Care | Payer: Medicaid Other | Attending: Emergency Medicine | Admitting: Emergency Medicine

## 2023-06-14 ENCOUNTER — Emergency Department (HOSPITAL_COMMUNITY): Payer: Medicaid Other

## 2023-06-14 ENCOUNTER — Other Ambulatory Visit: Payer: Self-pay

## 2023-06-14 ENCOUNTER — Encounter (HOSPITAL_COMMUNITY): Payer: Self-pay | Admitting: *Deleted

## 2023-06-14 DIAGNOSIS — S93432A Sprain of tibiofibular ligament of left ankle, initial encounter: Secondary | ICD-10-CM | POA: Insufficient documentation

## 2023-06-14 DIAGNOSIS — M7989 Other specified soft tissue disorders: Secondary | ICD-10-CM | POA: Insufficient documentation

## 2023-06-14 DIAGNOSIS — Y9301 Activity, walking, marching and hiking: Secondary | ICD-10-CM | POA: Insufficient documentation

## 2023-06-14 DIAGNOSIS — X501XXA Overexertion from prolonged static or awkward postures, initial encounter: Secondary | ICD-10-CM | POA: Insufficient documentation

## 2023-06-14 DIAGNOSIS — S93492A Sprain of other ligament of left ankle, initial encounter: Secondary | ICD-10-CM

## 2023-06-14 MED ORDER — DICLOFENAC SODIUM 1 % EX GEL: 2.0000 g | Freq: Four times a day (QID) | CUTANEOUS | 0 refills | Status: AC

## 2023-06-14 NOTE — ED Provider Notes (Signed)
[facility] EMERGENCY DEPARTMENT AT [facility] Provider Note   CSN: [id number] Arrival date & time: 06/14/23  2150     History  Chief Complaint  Patient presents with   Ankle Pain    Stacey Ramos is a 42 y.o. female.  The history is provided by the patient and medical records. No language interpreter was used.  Ankle Pain    This is a 42 year old female with significant history of vitamin D deficiency, recurrent ankle sprain, polycystic ovarian syndrome, GERD, anemia, depression, presenting with complaints of ankle injury.  Patient states yesterday she was walking at the step and when she reached to the last step her left ankle gave out on her and she twisted and fell down with a weight on her ankle.  She denies hitting her head or loss of consciousness.  She denies any precipitating symptoms prior to the fall.  She has been trying to avoid bearing weight on her ankles as it is increasing her pain.  Pain is sharp throbbing nonradiating without any numbness or weakness.  She tries using ice with adequate relief.  Tylenol has not helped.  She has history of gastric bypass and therefore she avoids NSAIDs.  Home Medications Prior to Admission medications   Medication Sig Start Date End Date Taking? Authorizing Provider  Calcium Citrate-Vitamin D 500-500 MG-UNT/5GM POWD Take by mouth.    [provider]  Cyanocobalamin (VITAMIN B12 PO) Take 1 tablet by mouth daily.    [provider]  Folic Acid 5 MG CAPS  07/15/21   [provider]  lisdexamfetamine (VYVANSE) 50 MG capsule Take 1 capsule (50 mg total) by mouth daily before breakfast. 12/14/21 01/13/22  Helane Rima, DO  lisdexamfetamine (VYVANSE) 50 MG capsule Take 1 capsule (50 mg total) by mouth daily before breakfast. 01/13/22 02/12/22  Helane Rima, DO  lisdexamfetamine (VYVANSE) 50 MG capsule Take 1 capsule (50 mg total) by mouth daily before breakfast. 12/17/21 01/16/22  Helane Rima, DO   metroNIDAZOLE (FLAGYL) 500 MG tablet Take 1 tablet (500 mg total) by mouth 2 (two) times daily. 06/27/22   Calvert Cantor, CNM  multivitamin (VIT Lorel [place] C) CHEW chewable tablet Chew 2 tablets by mouth daily.    [provider]  norethindrone (AYGESTIN) 5 MG tablet Take 1 tablet (5 mg total) by mouth daily. Take with painful and heavy cycles 08/20/22   Reva Bores, MD  Pediatric Multivitamins-Iron [facility] COMPLETE PO) Take 2 tablets by mouth daily.    [provider]  Vitamin D, Ergocalciferol, (DRISDOL) 1.25 MG (50000 UNIT) CAPS capsule Take 50,000 Units by mouth once a week. 05/01/22   [provider]      Allergies    Patient has no known allergies.    Review of Systems   Review of Systems  All other systems reviewed and are negative.   Physical Exam Updated Vital Signs BP 118/70 (BP Location: Left Arm)   Pulse 82   Resp 16   SpO2 100%  Physical Exam Vitals and nursing note reviewed.  Constitutional:      General: She is not in acute distress.    Appearance: She is well-developed.  HENT:     Head: Atraumatic.  Eyes:     Conjunctiva/sclera: Conjunctivae normal.  Pulmonary:     Effort: Pulmonary effort is normal.  Musculoskeletal:        General: Signs of injury (Left ankle: Mild edema noted to both medial and lateral malleoli region with tenderness to  palpation.  Mild tenderness to the Achilles region.  No erythema or warmth noted.  Radial pulse 2+.  No deformity.  Ankle with decreased range of motion 2/2 pain.) present.     Cervical back: Neck supple.     Comments: Left hip and knee nontender to palpation.  Skin:    Findings: No rash.  Neurological:     Mental Status: She is alert.  Psychiatric:        Mood and Affect: Mood normal.     ED Results / Procedures / Treatments   Labs (all labs ordered are listed, but only abnormal results are displayed) Labs Reviewed - No data to display  EKG None  Radiology DG Ankle  Complete Left  Result Date: 06/14/2023 CLINICAL DATA:  Left ankle injury and swelling EXAM: LEFT ANKLE COMPLETE - 3+ VIEW COMPARISON:  None Available. FINDINGS: There is no evidence of fracture, dislocation, or joint effusion. There is no evidence of arthropathy or significant focal bone abnormality. There is a small noninflammatory plantar calcaneal spur. Moderate soft tissue swelling extends from the ankle over the hindfoot. IMPRESSION: Soft tissue swelling without evidence of fractures or malalignment. Electronically Signed   By: Almira Bar M.D.   On: 06/14/2023 22:24    Procedures Procedures    Medications Ordered in ED Medications - No data to display  ED Course/ Medical Decision Making/ A&P                                 Medical Decision Making Amount and/or Complexity of Data Reviewed Radiology: ordered.   BP 118/70 (BP Location: Left Arm)   Pulse 82   Resp 16   SpO2 100%   11:17 PM This is a 42 year old female with significant history of vitamin D deficiency, recurrent ankle sprain, polycystic ovarian syndrome, GERD, anemia, depression, presenting with complaints of ankle injury.  Patient states yesterday she was walking at the step and when she reached to the last step her left ankle gave out on her and she twisted and fell down with a weight on her ankle.  She denies hitting her head or loss of consciousness.  She denies any precipitating symptoms prior to the fall.  She has been trying to avoid bearing weight on her ankles as it is increasing her pain.  Pain is sharp throbbing nonradiating without any numbness or weakness.  She tries using ice with adequate relief.  Tylenol has not helped.  She has history of gastric bypass and therefore she avoids NSAIDs.  On exam, patient is laying in bed appears to be in no acute discomfort.  Left ankle is mildly edematous and tender to palpation without any obvious deformity.  She is neurovascular intact.  No tenderness to her foot,  left knee or left hip.  X-ray of left ankle obtained independently viewed inter by me and are overall reassuring, agree with radiology interpretation.  DDx: Strain, sprain, fracture, dislocation, cellulitis, contusion.  This is an ankle sprain, will provide ASO for support.  I offer crutches but patient declined.  RICE therapy discussed but overall patient stable for discharge.  Will provide referral to orthopedic specialist for outpatient follow-up as needed.        Final Clinical Impression(s) / ED Diagnoses Final diagnoses:  Sprain of posterior talofibular ligament of left ankle, initial encounter    Rx / DC Orders ED Discharge Orders  Ordered    diclofenac Sodium (VOLTAREN) 1 % GEL  4 times daily        06/14/23 2322              Fayrene Helper, PA-C 06/14/23 1610    Rolan Bucco, MD 06/18/23 [phone]

## 2023-06-14 NOTE — ED Triage Notes (Signed)
Pt reporting she was walking down steps yesterday and injured her let ankle. Left ankle pain and swelling.

## 2023-06-14 NOTE — Discharge Instructions (Addendum)
You have been evaluated for your injury.  Fortunately x-ray today did not show any evidence of any broken bone or dislocation.  You have sprained your ankle.  Wear ankle brace for support.  Keep your ankle elevated at rest.  You may apply Voltaren gel to the affected area up to 4 times daily for symptom control.  You may follow-up with orthopedic specialist as needed.

## 2024-04-10 ENCOUNTER — Observation Stay (HOSPITAL_COMMUNITY)
Admission: AD | Admit: 2024-04-10 | Discharge: 2024-04-11 | Disposition: A | Discharge status: Home / Self Care | Payer: Self-pay | Attending: Obstetrics & Gynecology | Admitting: Obstetrics & Gynecology

## 2024-04-10 ENCOUNTER — Encounter (HOSPITAL_COMMUNITY): Payer: Self-pay | Admitting: Obstetrics & Gynecology

## 2024-04-10 DIAGNOSIS — O00102 Left tubal pregnancy without intrauterine pregnancy: Principal | ICD-10-CM | POA: Insufficient documentation

## 2024-04-10 DIAGNOSIS — N760 Acute vaginitis: Secondary | ICD-10-CM | POA: Insufficient documentation

## 2024-04-10 DIAGNOSIS — N80102 Endometriosis of left ovary, unspecified depth: Secondary | ICD-10-CM | POA: Insufficient documentation

## 2024-04-10 DIAGNOSIS — N809 Endometriosis, unspecified: Secondary | ICD-10-CM | POA: Diagnosis present

## 2024-04-10 DIAGNOSIS — J45909 Unspecified asthma, uncomplicated: Secondary | ICD-10-CM | POA: Insufficient documentation

## 2024-04-10 DIAGNOSIS — O009 Unspecified ectopic pregnancy without intrauterine pregnancy: Secondary | ICD-10-CM | POA: Diagnosis present

## 2024-04-10 DIAGNOSIS — Z87891 Personal history of nicotine dependence: Secondary | ICD-10-CM | POA: Insufficient documentation

## 2024-04-10 DIAGNOSIS — Z79899 Other long term (current) drug therapy: Secondary | ICD-10-CM | POA: Insufficient documentation

## 2024-04-10 DIAGNOSIS — N390 Urinary tract infection, site not specified: Secondary | ICD-10-CM | POA: Diagnosis present

## 2024-04-10 DIAGNOSIS — N736 Female pelvic peritoneal adhesions (postinfective): Secondary | ICD-10-CM | POA: Diagnosis present

## 2024-04-10 DIAGNOSIS — B9689 Other specified bacterial agents as the cause of diseases classified elsewhere: Secondary | ICD-10-CM | POA: Diagnosis present

## 2024-04-10 LAB — URINALYSIS, ROUTINE W REFLEX MICROSCOPIC
Bilirubin Urine: NEGATIVE
Glucose, UA: NEGATIVE mg/dL
Hgb urine dipstick: NEGATIVE
Ketones, ur: NEGATIVE mg/dL
Nitrite: POSITIVE — AB
Protein, ur: NEGATIVE mg/dL
Specific Gravity, Urine: 1.024 (ref 1.005–1.030)
pH: 5 (ref 5.0–8.0)

## 2024-04-10 NOTE — MAU Note (Signed)
 Stacey Ramos is a 43 y.o. at Unknown here in MAU reporting: had sever pain yesterday that felt like gas in her left side. Pain is less today but still there feels like a lot of pressure especially when she sits. . She has had an ectopic pregnancy berfore with rupture and removal of right tube. Has had some spotting off and on for a few days.  Had a full period  02/18/24 and spotting for 3 days in June 20th. Had a positive  pregnancy test   LMP: 02/18/24 Onset of complaint: yesterday Pain score: 6-7 Vitals:   04/10/24 2317  BP: (!) 113/59  Pulse: 84  Resp: 18  Temp: 98.5 F (36.9 C)     FHT: n/a  Lab orders placed from triage: u/a UPT  wet prep gc

## 2024-04-11 ENCOUNTER — Inpatient Hospital Stay (HOSPITAL_COMMUNITY): Payer: Self-pay

## 2024-04-11 ENCOUNTER — Observation Stay (HOSPITAL_COMMUNITY): Payer: Self-pay

## 2024-04-11 ENCOUNTER — Other Ambulatory Visit: Payer: Self-pay

## 2024-04-11 ENCOUNTER — Encounter (HOSPITAL_COMMUNITY): Payer: Self-pay | Admitting: Obstetrics & Gynecology

## 2024-04-11 ENCOUNTER — Encounter (HOSPITAL_COMMUNITY)
Admission: AD | Disposition: A | Discharge status: Home / Self Care | Payer: Self-pay | Source: Home / Self Care | Attending: Obstetrics & Gynecology

## 2024-04-11 DIAGNOSIS — Z3A01 Less than 8 weeks gestation of pregnancy: Secondary | ICD-10-CM

## 2024-04-11 DIAGNOSIS — O00102 Left tubal pregnancy without intrauterine pregnancy: Secondary | ICD-10-CM

## 2024-04-11 DIAGNOSIS — O00109 Unspecified tubal pregnancy without intrauterine pregnancy: Secondary | ICD-10-CM

## 2024-04-11 DIAGNOSIS — N390 Urinary tract infection, site not specified: Secondary | ICD-10-CM | POA: Diagnosis present

## 2024-04-11 DIAGNOSIS — O009 Unspecified ectopic pregnancy without intrauterine pregnancy: Secondary | ICD-10-CM

## 2024-04-11 DIAGNOSIS — N809 Endometriosis, unspecified: Secondary | ICD-10-CM

## 2024-04-11 DIAGNOSIS — B9689 Other specified bacterial agents as the cause of diseases classified elsewhere: Secondary | ICD-10-CM | POA: Diagnosis present

## 2024-04-11 HISTORY — PX: XI ROBOTIC ASSISTED SALPINGECTOMY: SHX6824

## 2024-04-11 HISTORY — PX: DIAGNOSTIC LAPAROSCOPY WITH REMOVAL OF ECTOPIC PREGNANCY: SHX6449

## 2024-04-11 LAB — COMPREHENSIVE METABOLIC PANEL WITH GFR
ALT: 12 U/L (ref 0–44)
AST: 34 U/L (ref 15–41)
Albumin: 3.5 g/dL (ref 3.5–5.0)
Alkaline Phosphatase: 40 U/L (ref 38–126)
Anion gap: 10 (ref 5–15)
BUN: 17 mg/dL (ref 6–20)
CO2: 22 mmol/L (ref 22–32)
Calcium: 8.8 mg/dL — ABNORMAL LOW (ref 8.9–10.3)
Chloride: 102 mmol/L (ref 98–111)
Creatinine, Ser: 0.67 mg/dL (ref 0.44–1.00)
GFR, Estimated: >60 mL/min (ref >60)
Glucose, Bld: 86 mg/dL (ref 70–99)
Potassium: 4.3 mmol/L (ref 3.5–5.1)
Sodium: 134 mmol/L — ABNORMAL LOW (ref 135–145)
Total Bilirubin: 1.2 mg/dL (ref 0.0–1.2)
Total Protein: 6 g/dL — ABNORMAL LOW (ref 6.5–8.1)

## 2024-04-11 LAB — CBC
HCT: 43.6 % (ref 36.0–46.0)
HCT: 44.6 % (ref 36.0–46.0)
Hemoglobin: 14.7 g/dL (ref 12.0–15.0)
Hemoglobin: 15 g/dL (ref 12.0–15.0)
MCH: 31 pg (ref 26.0–34.0)
MCH: 31.3 pg (ref 26.0–34.0)
MCHC: 33.6 g/dL (ref 30.0–36.0)
MCHC: 33.7 g/dL (ref 30.0–36.0)
MCV: 92.1 fL (ref 80.0–100.0)
MCV: 92.8 fL (ref 80.0–100.0)
Platelets: 254 K/uL (ref 150–400)
Platelets: 263 K/uL (ref 150–400)
RBC: 4.7 MIL/uL (ref 3.87–5.11)
RBC: 4.84 MIL/uL (ref 3.87–5.11)
RDW: 12.7 % (ref 11.5–15.5)
RDW: 12.8 % (ref 11.5–15.5)
WBC: 8.5 K/uL (ref 4.0–10.5)
WBC: 8.5 K/uL (ref 4.0–10.5)
nRBC: 0 % (ref 0.0–0.2)
nRBC: 0 % (ref 0.0–0.2)

## 2024-04-11 LAB — POCT PREGNANCY, URINE: Preg Test, Ur: POSITIVE — AB

## 2024-04-11 LAB — GC/CHLAMYDIA PROBE AMP (~~LOC~~) NOT AT ARMC
Chlamydia: NEGATIVE
Comment: NEGATIVE
Comment: NORMAL
Neisseria Gonorrhea: NEGATIVE

## 2024-04-11 LAB — TYPE AND SCREEN
ABO/RH(D): O POS
Antibody Screen: NEGATIVE

## 2024-04-11 LAB — RPR: RPR Ser Ql: NONREACTIVE

## 2024-04-11 LAB — WET PREP, GENITAL
Sperm: NONE SEEN
Trich, Wet Prep: NONE SEEN
WBC, Wet Prep HPF POC: <10 (ref <10)
Yeast Wet Prep HPF POC: NONE SEEN

## 2024-04-11 LAB — HCG, QUANTITATIVE, PREGNANCY: hCG, Beta Chain, Quant, S: 23621 m[IU]/mL — ABNORMAL HIGH (ref <5)

## 2024-04-11 LAB — HIV ANTIBODY (ROUTINE TESTING W REFLEX): HIV Screen 4th Generation wRfx: NONREACTIVE

## 2024-04-11 SURGERY — SALPINGECTOMY, ROBOT-ASSISTED
Anesthesia: General | Site: Pelvis | Laterality: Left

## 2024-04-11 MED ORDER — POVIDONE-IODINE 10 % EX SWAB: 2.0000 | Freq: Once | CUTANEOUS | Status: DC

## 2024-04-11 MED ORDER — BUPIVACAINE HCL (PF) 0.25 % IJ SOLN
INTRAMUSCULAR | Status: AC
Start: 2024-04-11 — End: 2024-04-11
  Filled 2024-04-11: qty 10

## 2024-04-11 MED ORDER — MIDAZOLAM HCL 2 MG/2ML IJ SOLN
INTRAMUSCULAR | Status: DC | PRN
  Administered 2024-04-11: 2 mg via INTRAVENOUS

## 2024-04-11 MED ORDER — LACTATED RINGERS IV SOLN: INTRAVENOUS | Status: DC

## 2024-04-11 MED ORDER — OXYCODONE HCL 5 MG PO TABS: 5.0000 mg | ORAL_TABLET | Freq: Once | ORAL | Status: DC | PRN

## 2024-04-11 MED ORDER — LIDOCAINE 2% (20 MG/ML) 5 ML SYRINGE
INTRAMUSCULAR | Status: AC
  Filled 2024-04-11: qty 5

## 2024-04-11 MED ORDER — FENTANYL CITRATE (PF) 250 MCG/5ML IJ SOLN
INTRAMUSCULAR | Status: AC
  Filled 2024-04-11: qty 5

## 2024-04-11 MED ORDER — FENTANYL CITRATE (PF) 100 MCG/2ML IJ SOLN: 25.0000 ug | INTRAMUSCULAR | Status: DC | PRN

## 2024-04-11 MED ORDER — MEPERIDINE HCL 25 MG/ML IJ SOLN: 6.2500 mg | INTRAMUSCULAR | Status: DC | PRN

## 2024-04-11 MED ORDER — ORAL CARE MOUTH RINSE: 15.0000 mL | Freq: Once | OROMUCOSAL | Status: DC

## 2024-04-11 MED ORDER — SODIUM CHLORIDE 0.9 % IR SOLN
Status: DC | PRN
  Administered 2024-04-11: 1000 mL

## 2024-04-11 MED ORDER — SCOPOLAMINE 1 MG/3DAYS TD PT72
MEDICATED_PATCH | TRANSDERMAL | Status: DC | PRN
  Administered 2024-04-11: 1 via TRANSDERMAL

## 2024-04-11 MED ORDER — 0.9 % SODIUM CHLORIDE (POUR BTL) OPTIME
TOPICAL | Status: DC | PRN
  Administered 2024-04-11: 1000 mL

## 2024-04-11 MED ORDER — HEMOSTATIC AGENTS (NO CHARGE) OPTIME
TOPICAL | Status: DC | PRN
  Administered 2024-04-11: 1

## 2024-04-11 MED ORDER — PROPOFOL 1000 MG/100ML IV EMUL
INTRAVENOUS | Status: AC
  Filled 2024-04-11: qty 100

## 2024-04-11 MED ORDER — MIDAZOLAM HCL 2 MG/2ML IJ SOLN
INTRAMUSCULAR | Status: AC
  Filled 2024-04-11: qty 2

## 2024-04-11 MED ORDER — BUPIVACAINE HCL (PF) 0.25 % IJ SOLN
INTRAMUSCULAR | Status: DC | PRN
  Administered 2024-04-11: 40 mL

## 2024-04-11 MED ORDER — ACETAMINOPHEN 10 MG/ML IV SOLN
INTRAVENOUS | Status: AC
Start: 2024-04-11 — End: 2024-04-11
  Filled 2024-04-11: qty 100

## 2024-04-11 MED ORDER — HYDROMORPHONE HCL 1 MG/ML IJ SOLN: 0.5000 mg | INTRAMUSCULAR | Status: DC | PRN

## 2024-04-11 MED ORDER — EPHEDRINE 5 MG/ML INJ
INTRAVENOUS | Status: AC
Start: 2024-04-11 — End: 2024-04-11
  Filled 2024-04-11: qty 5

## 2024-04-11 MED ORDER — ROCURONIUM BROMIDE 10 MG/ML (PF) SYRINGE
PREFILLED_SYRINGE | INTRAVENOUS | Status: DC | PRN
  Administered 2024-04-11: 60 mg via INTRAVENOUS

## 2024-04-11 MED ORDER — PROPOFOL 10 MG/ML IV BOLUS
INTRAVENOUS | Status: AC
  Filled 2024-04-11: qty 20

## 2024-04-11 MED ORDER — ACETAMINOPHEN 325 MG PO TABS: 650.0000 mg | ORAL_TABLET | Freq: Four times a day (QID) | ORAL | 0 refills | Status: AC | PRN

## 2024-04-11 MED ORDER — KETOROLAC TROMETHAMINE 30 MG/ML IJ SOLN
INTRAMUSCULAR | Status: DC | PRN
  Administered 2024-04-11: 30 mg via INTRAVENOUS

## 2024-04-11 MED ORDER — SUGAMMADEX SODIUM 200 MG/2ML IV SOLN
INTRAVENOUS | Status: DC | PRN
  Administered 2024-04-11: 200 mg via INTRAVENOUS

## 2024-04-11 MED ORDER — CHLORHEXIDINE GLUCONATE 0.12 % MT SOLN
OROMUCOSAL | Status: AC
  Filled 2024-04-11: qty 15

## 2024-04-11 MED ORDER — BUPIVACAINE HCL (PF) 0.25 % IJ SOLN
INTRAMUSCULAR | Status: AC
  Filled 2024-04-11: qty 30

## 2024-04-11 MED ORDER — OXYCODONE HCL 5 MG/5ML PO SOLN: 5.0000 mg | Freq: Once | ORAL | Status: DC | PRN

## 2024-04-11 MED ORDER — ALBUMIN HUMAN 5 % IV SOLN: INTRAVENOUS | Status: DC | PRN

## 2024-04-11 MED ORDER — ONDANSETRON HCL 4 MG/2ML IJ SOLN: 4.0000 mg | Freq: Once | INTRAMUSCULAR | Status: DC | PRN

## 2024-04-11 MED ORDER — PHENYLEPHRINE 80 MCG/ML (10ML) SYRINGE FOR IV PUSH (FOR BLOOD PRESSURE SUPPORT)
PREFILLED_SYRINGE | INTRAVENOUS | Status: DC | PRN
  Administered 2024-04-11 (×2): 160 ug via INTRAVENOUS
  Administered 2024-04-11 (×2): 80 ug via INTRAVENOUS

## 2024-04-11 MED ORDER — ONDANSETRON 4 MG PO TBDP: 4.0000 mg | ORAL_TABLET | Freq: Three times a day (TID) | ORAL | 0 refills | Status: AC | PRN

## 2024-04-11 MED ORDER — FENTANYL CITRATE (PF) 250 MCG/5ML IJ SOLN
INTRAMUSCULAR | Status: DC | PRN
  Administered 2024-04-11 (×3): 50 ug via INTRAVENOUS
  Administered 2024-04-11: 100 ug via INTRAVENOUS

## 2024-04-11 MED ORDER — CHLORHEXIDINE GLUCONATE 0.12 % MT SOLN: 15.0000 mL | Freq: Once | OROMUCOSAL | Status: DC

## 2024-04-11 MED ORDER — ONDANSETRON HCL 4 MG/2ML IJ SOLN
INTRAMUSCULAR | Status: DC | PRN
  Administered 2024-04-11: 4 mg via INTRAVENOUS

## 2024-04-11 MED ORDER — DEXAMETHASONE SODIUM PHOSPHATE 10 MG/ML IJ SOLN
INTRAMUSCULAR | Status: DC | PRN
  Administered 2024-04-11: 8 mg via INTRAVENOUS

## 2024-04-11 MED ORDER — DOCUSATE SODIUM 100 MG PO CAPS: 100.0000 mg | ORAL_CAPSULE | Freq: Two times a day (BID) | ORAL | 0 refills | Status: AC

## 2024-04-11 MED ORDER — PROPOFOL 10 MG/ML IV BOLUS
INTRAVENOUS | Status: DC | PRN
  Administered 2024-04-11: 180 mg via INTRAVENOUS

## 2024-04-11 MED ORDER — DEXAMETHASONE SODIUM PHOSPHATE 10 MG/ML IJ SOLN
INTRAMUSCULAR | Status: AC
  Filled 2024-04-11: qty 1

## 2024-04-11 MED ORDER — PHENYLEPHRINE 80 MCG/ML (10ML) SYRINGE FOR IV PUSH (FOR BLOOD PRESSURE SUPPORT)
PREFILLED_SYRINGE | INTRAVENOUS | Status: AC
  Filled 2024-04-11: qty 10

## 2024-04-11 MED ORDER — SODIUM CHLORIDE (PF) 0.9 % IJ SOLN
INTRAMUSCULAR | Status: AC
Start: 2024-04-11 — End: 2024-04-11
  Filled 2024-04-11: qty 10

## 2024-04-11 MED ORDER — ROCURONIUM BROMIDE 10 MG/ML (PF) SYRINGE
PREFILLED_SYRINGE | INTRAVENOUS | Status: AC
  Filled 2024-04-11: qty 10

## 2024-04-11 MED ORDER — SCOPOLAMINE 1 MG/3DAYS TD PT72
MEDICATED_PATCH | TRANSDERMAL | Status: AC
Start: 2024-04-11 — End: 2024-04-11
  Filled 2024-04-11: qty 1

## 2024-04-11 MED ORDER — OXYCODONE HCL 5 MG PO TABS: 5.0000 mg | ORAL_TABLET | Freq: Four times a day (QID) | ORAL | 0 refills | Status: AC | PRN

## 2024-04-11 MED ORDER — SODIUM CHLORIDE 0.9 % IV SOLN
2.0000 g | INTRAVENOUS | Status: AC
  Administered 2024-04-11: 2 g via INTRAVENOUS
  Filled 2024-04-11 (×2): qty 2

## 2024-04-11 MED ORDER — SUGAMMADEX SODIUM 200 MG/2ML IV SOLN
INTRAVENOUS | Status: AC
  Filled 2024-04-11: qty 2

## 2024-04-11 MED ORDER — ONDANSETRON HCL 4 MG/2ML IJ SOLN
INTRAMUSCULAR | Status: AC
  Filled 2024-04-11: qty 2

## 2024-04-11 MED ORDER — LIDOCAINE HCL (CARDIAC) PF 100 MG/5ML IV SOSY
PREFILLED_SYRINGE | INTRAVENOUS | Status: DC | PRN
  Administered 2024-04-11: 80 mg via INTRAVENOUS

## 2024-04-11 MED ORDER — EPHEDRINE SULFATE-NACL 50-0.9 MG/10ML-% IV SOSY
PREFILLED_SYRINGE | INTRAVENOUS | Status: DC | PRN
  Administered 2024-04-11 (×3): 5 mg via INTRAVENOUS

## 2024-04-11 MED ORDER — KETOROLAC TROMETHAMINE 30 MG/ML IJ SOLN
INTRAMUSCULAR | Status: AC
  Filled 2024-04-11: qty 1

## 2024-04-11 MED ORDER — ACETAMINOPHEN 10 MG/ML IV SOLN
INTRAVENOUS | Status: DC | PRN
  Administered 2024-04-11: 1000 mg via INTRAVENOUS

## 2024-04-11 SURGICAL SUPPLY — 56 items
APPLICATOR ARISTA FLEXITIP XL (MISCELLANEOUS) IMPLANT
CANNULA CAP OBTURATR AIRSEAL 8 (CAP) ×2 IMPLANT
CANNULA REDUCER 12-8 DVNC XI (CANNULA) IMPLANT
CATH FOLEY 3WAY 5CC 16FR (CATHETERS) ×2 IMPLANT
COVER BACK TABLE 60X90IN (DRAPES) ×2 IMPLANT
COVER MAYO STAND STRL (DRAPES) ×2 IMPLANT
COVER TIP SHEARS 8 DVNC (MISCELLANEOUS) ×2 IMPLANT
DEFOGGER SCOPE WARM SEASHARP (MISCELLANEOUS) ×2 IMPLANT
DERMABOND ADVANCED .7 DNX12 (GAUZE/BANDAGES/DRESSINGS) ×2 IMPLANT
DRAPE ARM DVNC X/XI (DISPOSABLE) ×8 IMPLANT
DRAPE COLUMN DVNC XI (DISPOSABLE) ×2 IMPLANT
DRAPE SURG IRRIG POUCH 19X23 (DRAPES) ×4 IMPLANT
DRAPE UTILITY XL STRL (DRAPES) ×2 IMPLANT
DRIVER NDL MEGA SUTCUT DVNCXI (INSTRUMENTS) ×2 IMPLANT
DRIVER NDLE MEGA SUTCUT DVNCXI (INSTRUMENTS) IMPLANT
DURAPREP 26ML APPLICATOR (WOUND CARE) ×2 IMPLANT
ELECTRODE REM PT RTRN 9FT ADLT (ELECTROSURGICAL) ×2 IMPLANT
FORCEPS BPLR FENES DVNC XI (FORCEP) ×2 IMPLANT
FORCEPS PROGRASP DVNC XI (FORCEP) IMPLANT
GAUZE 4X4 16PLY ~~LOC~~+RFID DBL (SPONGE) IMPLANT
GLOVE BIO SURGEON STRL SZ 6.5 (GLOVE) ×6 IMPLANT
HEMOSTAT ARISTA ABSORB 3G PWDR (HEMOSTASIS) IMPLANT
HOLDER FOLEY CATH W/STRAP (MISCELLANEOUS) IMPLANT
IRRIGATION SUCT STRKRFLW 2 WTP (MISCELLANEOUS) ×2 IMPLANT
KIT PINK PAD W/HEAD ARM REST (MISCELLANEOUS) ×2 IMPLANT
KIT TURNOVER KIT B (KITS) ×2 IMPLANT
LEGGING LITHOTOMY PAIR STRL (DRAPES) ×2 IMPLANT
MANIFOLD NEPTUNE II (INSTRUMENTS) ×2 IMPLANT
MANIPULATOR ADVINCU DEL 2.5 PL (MISCELLANEOUS) IMPLANT
MANIPULATOR ADVINCU DEL 3.0 PL (MISCELLANEOUS) IMPLANT
MANIPULATOR ADVINCU DEL 3.5 PL (MISCELLANEOUS) IMPLANT
MANIPULATOR ADVINCU DEL 4.0 PL (MISCELLANEOUS) IMPLANT
NDL INSUFFLATION 14GA 120MM (NEEDLE) ×2 IMPLANT
NEEDLE INSUFFLATION 14GA 120MM (NEEDLE) ×2 IMPLANT
OBTURATOR OPTICALSTD 8 DVNC (TROCAR) ×2 IMPLANT
OCCLUDER COLPOPNEUMO (BALLOONS) IMPLANT
PACK ROBOT WH (CUSTOM PROCEDURE TRAY) ×2 IMPLANT
PACK ROBOTIC GOWN (GOWN DISPOSABLE) ×2 IMPLANT
PAD OB MATERNITY 11 LF (PERSONAL CARE ITEMS) ×2 IMPLANT
SAVER CELL AAL HAEMONETICS (INSTRUMENTS) IMPLANT
SCISSORS MNPLR CVD DVNC XI (INSTRUMENTS) ×2 IMPLANT
SEAL UNIV 5-12 XI (MISCELLANEOUS) ×4 IMPLANT
SEALER VESSEL EXT DVNC XI (MISCELLANEOUS) IMPLANT
SET IRRIG Y TYPE TUR BLADDER L (SET/KITS/TRAYS/PACK) IMPLANT
SET TUBE FILTERED XL AIRSEAL (SET/KITS/TRAYS/PACK) ×2 IMPLANT
SLEEVE SCD COMPRESS KNEE MED (STOCKING) ×2 IMPLANT
SPIKE FLUID TRANSFER (MISCELLANEOUS) ×4 IMPLANT
SUT VIC AB 0 CT1 27XBRD ANBCTR (SUTURE) IMPLANT
SUT VIC AB 4-0 PS2 18 (SUTURE) ×2 IMPLANT
SUT VICRYL 0 UR6 27IN ABS (SUTURE) IMPLANT
SUT VLOC 180 0 9IN GS21 (SUTURE) ×2 IMPLANT
SYSTEM RETRIEVL 5MM INZII UNIV (BASKET) IMPLANT
TOWEL GREEN STERILE (TOWEL DISPOSABLE) ×2 IMPLANT
TROCAR PORT AIRSEAL 8X100 (TROCAR) IMPLANT
UNDERPAD 30X36 HEAVY ABSORB (UNDERPADS AND DIAPERS) ×2 IMPLANT
WATER STERILE IRR 1000ML POUR (IV SOLUTION) ×2 IMPLANT

## 2024-04-11 NOTE — Op Note (Signed)
 PREOPERATIVE DIAGNOSIS:  1) Ectopic pregnancy  POSTOPERATIVE DIAGNOSIS: same PROCEDURE PERFORMED: Robotic assisted- laparoscopic left salpingectomy with removal of ectopic, evacuation of hemoperitoneum SURGEON: Dr. Delon Prude RNFA: Heather  Krietemeyer ANESTHESIA: General endotracheal.  ESTIMATED BLOOD LOSS: 50 cc.  IV FLUIDS: 1100 cc of crystalloid + 250cc albumin  UOP:  400 cc SPECIMEN(S): left fallopian tube with ectopic COMPLICATIONS: None.  CONDITION: Stable.   FINDINGS: No ascites or peritoneal studding was appreciated.  ~ 50cc of hemoperitoneum noted. Uterus normal size and shape.  Absent right tube and ovary.  Left ectopic pregnancy noted within the isthmus portion.  Mild adhesions noted between left ovary and fallopian tube.  The adnexa was not adherent to the anterior abdominal or lateral side wall.  There was an area of adhesion noted between the bowel and left side wall.  Due to the location and density of the adhesions- lysis was not completed.  Care was taken to avoid this area when resection the adnexa.      Informed consent was obtained from the patient prior to taking her to the operating room where anesthesia was found to be adequate. She was placed in dorsal lithotomy position and examined under anesthesia. She was prepped and draped in normal sterile fashion. The bladder was catheterized with a foley under sterile technique.  A bi-valve speculum was then placed.  The anterior lip of the cervix was grasped with the single tooth tenaculum.  The hulka uterine manipulator was then advanced into the uterus to provide uterine mobility. The speculum and tenaculum were then removed.    Attention was then turned to the patient's abdomen where an 8mm supraumbilical skin incision was made with the scalpel. The veress needle was carefully introduced into the peritoneal cavity while tenting the abdominal wall. Intraperitoneal placement was confirmed by use of a saline-drop test.  The gas  was connected and confirmed intrabdominal placement by a low initial pressure of 4 mmHg. The abdomen was then insuflated with CO2 gas. The trocar and sleeve were then advanced without difficulty into the abdomen under direct visualization. Intraabdominal placement was confirmed by the laparoscope and surveillance of the abdomen was performed. Grossly normal appearing abdomen as mentioned in findings above.  Two additional 8mm skin incision were made  with placement of the trocar under direct visualization. The patient was placed in Trendelenburg position and the [employer] robotic device was docked.  Next, attention was turned to the console where removal of the ectopic was completed.   The left fallopian tube was grasped and using the robotic scissors the fallopian tube with ectopic was ligated.    Evacuation of the hemoperitoneum was completed. The tube was removed through the port without complications. Excellent hemostasis was noted.  Arista was placed.  Under direct visualization TAP block was completed under direct visualization using 10cc of 0.25% marcaine  in each of four locations. The instruments were then removed from the patients abdomen with air allowed to fully escape. All ports were closed with monocryl and dermabond.  The manipulator was removed from the cervix with no lacerations or bleeding identified. The patient tolerated the procedure well with all sponge, lap, and needle counts correct. The patient was taken to recovery in stable condition.   Liberato Stansbery, DO Attending Obstetrician & Gynecologist, [facility] for [employer], [facility]

## 2024-04-11 NOTE — Discharge Instructions (Addendum)
 DO NOT TAKE TYLENOL  UNTIL AFTER 9PM DO NOT TAKE MOTRIN UNTIL AFTER 9:15PM   HOME INSTRUCTIONS  Please note any unusual or excessive bleeding, pain, swelling. Mild dizziness or drowsiness are normal for about 24 hours after surgery.   Shower when comfortable  Restrictions: No driving for 24 hours or while taking pain medications.  Activity:  No heavy lifting (> 10 lbs), nothing in vagina (no tampons, douching, or intercourse) x 4 weeks; no tub baths for 4 weeks Vaginal spotting is expected but if your bleeding is heavy, period like,  please call the office   Incision: the bandaids will fall off when they are ready to; you may clean your incision with mild soap and water but do not rub or scrub the incision site.  You may experience slight bloody drainage from your incision periodically.  This is normal.  If you experience a large amount of drainage or the incision opens, please call your physician who will likely direct you to the emergency department.  Diet:  You may return to your regular diet.  Do not eat large meals.  Eat small frequent meals throughout the day.  Continue to drink a good amount of water at least 6-8 glasses of water per day, hydration is very important for the healing process.  Pain Management: Take Tylenol  every 6 hours with food as needed for pain.  For moderate to severe pain, a prescription of Oxycodone  has been sent in.   This medication can either be taken along with the oxycodone  or a few hours after the tylenol  if you feel like you need additional pain medication.  If possible for breakthrough pain take ibuprofen over the counter.   Always take prescription pain medication with food.  Oxycodone  may cause constipation, you may want to take a stool softener while taking this medication.  A prescription of colace has been sent in to take twice daily if needed while taking the oxycodone .  Be sure to drink plenty of fluids and increase your fiber to help with  constipation.  Alcohol -- Avoid for 24 hours and while taking pain medications.  Nausea: Take sips of ginger ale or soda  Fever -- Call physician if temperature over 101 degrees  Follow up:  If you do not already have a follow up appointment scheduled, please call the office at [phone].  If you experience fever (a temperature greater than 100.4), pain unrelieved by pain medication, shortness of breath, swelling of a single leg, or any other symptoms which are concerning to you please the office immediately.  Post Anesthesia Home Care Instructions  Activity: Get plenty of rest for the remainder of the day. A responsible adult should stay with you for 24 hours following the procedure.  For the next 24 hours, DO NOT: -Drive a car -Advertising copywriter -Drink alcoholic beverages -Take any medication unless instructed by your physician -Make any legal decisions or sign important papers.  Meals: Start with liquid foods such as gelatin or soup. Progress to regular foods as tolerated. Avoid greasy, spicy, heavy foods. If nausea and/or vomiting occur, drink only clear liquids until the nausea and/or vomiting subsides. Call your physician if vomiting continues.  Special Instructions/Symptoms: Your throat may feel dry or sore from the anesthesia or the breathing tube placed in your throat during surgery. If this causes discomfort, gargle with warm salt water. The discomfort should disappear within 24 hours.  If you had a scopolamine  patch placed behind your ear for the management of  post- operative nausea and/or vomiting:  1. The medication in the patch is effective for 72 hours, after which it should be removed.  Wrap patch in a tissue and discard in the trash. Wash hands thoroughly with soap and water. 2. You may remove the patch earlier than 72 hours if you experience unpleasant side effects which may include dry mouth, dizziness or visual disturbances. 3. Avoid touching the patch. Wash your  hands with soap and water after contact with the patch.  Call your surgeon if you experience:   1.  Fever over 101.0. 2.  Inability to urinate. 3.  Nausea and/or vomiting. 4.  Extreme swelling or bruising at the surgical site. 5.  Continued bleeding from the incision. 6.  Increased pain, redness or drainage from the incision. 7.  Problems related to your pain medication. 8. Any change in color, movement and/or sensation 9. Any problems and/or concerns

## 2024-04-11 NOTE — Transfer of Care (Signed)
 Immediate Anesthesia Transfer of Care Note  Patient: Stacey Ramos  Procedure(s) Performed: LEFT SALPINGECTOMY, ROBOT-ASSISTED (Left: Pelvis) ROBOT-ASSISTED LAPAROSCOPY, WITH ECTOPIC PREGNANCY SURGICAL TREATMENT (Pelvis)  Patient Location: PACU  Anesthesia Type:General  Level of Consciousness: drowsy  Airway & Oxygen Therapy: Patient Spontanous Breathing and Patient connected to nasal cannula oxygen  Post-op Assessment: Report given to RN and Post -op Vital signs reviewed and stable  Post vital signs: Reviewed and stable  Last Vitals:  Vitals Value Taken Time  BP 96/49 04/11/24 15:32  Temp    Pulse 67 04/11/24 15:33  Resp 23 04/11/24 15:33  SpO2 99 % 04/11/24 15:33  Vitals shown include unfiled device data.  Last Pain:  Vitals:   04/11/24 1340  TempSrc:   PainSc: 0-No pain      Patients Stated Pain Goal: 3 (04/11/24 0300)  Complications: No notable events documented.

## 2024-04-11 NOTE — Interval H&P Note (Addendum)
 History and Physical Interval Note: 04/11/2024 7:52 AM  Patient slept well, no new symptoms.  Pain is minimal.  Blood pressure (!) 94/48, pulse (!) 56, temperature 98.1 F (36.7 C), temperature source Oral, resp. rate 18, height 5' 4 (1.626 m), weight 90.8 kg, last menstrual period 02/18/2024, unknown if currently breastfeeding. Minimal LLQ pain on palpation.     Latest Ref Rng & Units 04/11/2024    2:30 AM 04/10/2024   11:52 PM 04/10/2022    4:48 PM  CBC  WBC 4.0 - 10.5 K/uL 8.5  8.5  9.4   Hemoglobin 12.0 - 15.0 g/dL 84.9  85.2  85.8   Hematocrit 36.0 - 46.0 % 44.6  43.6  42.5   Platelets 150 - 400 K/uL 263  254  280    Patient will go to OR for surgery later this morning, will discuss definitive timing with oncoming team. She will remain NPO until then.   GLORIS HUGGER, MD, FACOG Obstetrician & Gynecologist, [facility] for [facility], [facility]   Reviewed plan with patient this am- if possible will complete robotic assisted.  Pt aware of advantages and possible need for conversion.  OR informed.  Janasha Barkalow, DO Attending Obstetrician & Gynecologist, [facility] for [employer], [facility]

## 2024-04-11 NOTE — Anesthesia Postprocedure Evaluation (Signed)
 Anesthesia Post Note  Patient: Stacey Ramos  Procedure(s) Performed: LEFT SALPINGECTOMY, ROBOT-ASSISTED (Left: Pelvis) ROBOT-ASSISTED LAPAROSCOPY, WITH ECTOPIC PREGNANCY SURGICAL TREATMENT (Pelvis)     Anesthesia Type: General Anesthetic complications: no   No notable events documented.  Last Vitals:  Vitals:   04/11/24 1331 04/11/24 1530  BP: (!) 98/50 (!) 96/49  Pulse: (!) 55   Resp: 17   Temp: 36.6 C   SpO2: 100%     Last Pain:  Vitals:   04/11/24 1340  TempSrc:   PainSc: 0-No pain                 Lamyia Cdebaca

## 2024-04-11 NOTE — H&P (Signed)
 MAU Provider Note/Preoperative History and Physical  Stacey Ramos is a 43 y.o. G3P1011 with history of endometriosis and extensive pelvic adhesive disease s/p laparoscopic RSO and extensive LOA in 2021 for ectopic pregnancy, here with concern about another ectopic pregnancy.  She reported having a lot of abdominal pain yesterday and spotting, LMP 02/18/24.  She took a test at home which was positive, and she came here for further evaluation.  On encounter here, she reports mild abdominal pain, significantly decreased compared to yesterday.  Denies any abnormal vaginal discharge, fevers, chills, sweats, dysuria, nausea, vomiting, other GI or GU symptoms or other general symptoms.  Past Medical History:  Diagnosis Date   Anemia    Anxiety    Asthma    Back pain    Depression    Dysmenorrhea    Ectopic pregnancy 02/17/2020   Endometriosis    Fatty liver    GERD (gastroesophageal reflux disease)    Heart murmur 01/03/2022   Infertility, female    Iron deficiency anemia    Lower extremity edema    Multinodular goiter 10/05/2020   Over weight    Pelvic adhesive disease 07/29/2021   Polycystic ovarian syndrome    Psoriasis    Sleep apnea    SOB (shortness of breath)    Syncope due to orthostatic hypotension 09/17/2019   Vitamin D deficiency    Past Surgical History:  Procedure Laterality Date   ABLATION ON ENDOMETRIOSIS  2019   APPENDECTOMY     as a child. 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   LAPAROSCOPIC LYSIS OF ADHESIONS  02/17/2020   Procedure: Laparoscopic Lysis Of Adhesions;  Surgeon: Izell Harari, MD;  Location: [facility] OR;  Service: Gynecology;;   LAPAROSCOPIC UNILATERAL SALPINGO OOPHERECTOMY Right 02/17/2020   Procedure: LAPAROSCOPIC RIGHT SALPINGO OOPHORECTOMY WITH REMOVAL OF ECTOPIC PREGNANCY;  Surgeon: Izell Harari, MD;  Location: [facility] OR;  Service: Gynecology;  Laterality: Right;   TONSILLECTOMY     WISDOM TOOTH EXTRACTION     OB  History  Gravida Para Term Preterm AB Living  3 1 1  1 1   SAB IAB Ectopic Multiple Live Births    1  1    # Outcome Date GA Lbr Len/2nd Weight Sex Type Anes PTL Lv  3 Current           2 Ectopic 02/17/20 [redacted]w[redacted]d         1 Term      Vag-Spont     Patient denies any other pertinent gynecologic issues.   No current facility-administered medications on file prior to encounter.   Current Outpatient Medications on File Prior to Encounter  Medication Sig Dispense Refill   Calcium  Citrate-Vitamin D 500-500 MG-UNT/5GM POWD Take by mouth.     Cyanocobalamin (VITAMIN B12 PO) Take 1 tablet by mouth daily.     diclofenac  Sodium (VOLTAREN ) 1 % GEL Apply 2 g topically 4 (four) times daily. 150 g 0   Folic Acid 5 MG CAPS      lisdexamfetamine (VYVANSE ) 50 MG capsule Take 1 capsule (50 mg total) by mouth daily before breakfast. 30 capsule 0   lisdexamfetamine (VYVANSE ) 50 MG capsule Take 1 capsule (50 mg total) by mouth daily before breakfast. 30 capsule 0   lisdexamfetamine (VYVANSE ) 50 MG capsule Take 1 capsule (50 mg total) by mouth daily before breakfast. 30 capsule 0   metroNIDAZOLE  (FLAGYL ) 500 MG tablet Take 1 tablet (500 mg total) by  mouth 2 (two) times daily. 14 tablet 0   multivitamin (VIT W/EXTRA C) CHEW chewable tablet Chew 2 tablets by mouth daily.     norethindrone  (AYGESTIN ) 5 MG tablet Take 1 tablet (5 mg total) by mouth daily. Take with painful and heavy cycles 30 tablet 1   Pediatric Multivitamins-Iron (FLINTSTONES COMPLETE PO) Take 2 tablets by mouth daily.     Vitamin D, Ergocalciferol, (DRISDOL) 1.25 MG (50000 UNIT) CAPS capsule Take 50,000 Units by mouth once a week.     No Known Allergies  Social History:   reports that she quit smoking about 35 years ago. Her smoking use included cigarettes. She started smoking about 36 years ago. She has never used smokeless tobacco. She reports current alcohol use. She reports current drug use. Drug: Marijuana.  Family History  Problem  Relation Age of Onset   Diabetes Mother    Anxiety disorder Mother    Obesity Mother    Cancer Father    Multiple myeloma Father    Colon cancer Father    Diabetes Father    Stroke Father    Aneurysm Father    Depression Father    Schizophrenia Father    Liver disease Father    Diabetes Maternal Grandmother    Stroke Paternal Grandmother    Breast cancer Paternal Grandmother    Cancer Paternal Grandmother        breast   Colon cancer Paternal Grandfather     Review of Systems: Pertinent items noted in HPI and remainder of comprehensive ROS otherwise negative.  PHYSICAL EXAM: Blood pressure (!) 113/59, pulse 84, temperature 98.5 F (36.9 C), resp. rate 18, height 5' 4 (1.626 m), weight 90.8 kg, last menstrual period 02/18/2024, unknown if currently breastfeeding. CONSTITUTIONAL: Well-developed, well-nourished female in no acute distress.  HENT:  Normocephalic, atraumatic, External right and left ear normal. Oropharynx is clear and moist EYES: Conjunctivae and EOM are normal. Pupils are equal, round, and reactive to light. No scleral icterus.  NECK: Normal range of motion, supple, no masses SKIN: Skin is warm and dry. No rash noted. Not diaphoretic. No erythema. No pallor. NEUROLOGIC: Alert and oriented to person, place, and time. Normal reflexes, muscle tone coordination. No cranial nerve deficit noted. PSYCHIATRIC: Normal mood and affect. Normal behavior. Normal judgment and thought content. CARDIOVASCULAR: Normal heart rate noted, regular rhythm RESPIRATORY: Effort and breath sounds normal, no problems with respiration noted ABDOMEN: Soft, mild LLQ tenderness, no rebound or guarding, nontender, nondistended. PELVIC: Deferred MUSCULOSKELETAL: Normal range of motion. No edema and no tenderness. 2+ distal pulses.  Labs: Results for orders placed or performed during the hospital encounter of 04/10/24 (from the past 2 weeks)  Urinalysis, Routine w reflex microscopic -Urine,  Clean Catch   Collection Time: 04/10/24 10:39 PM  Result Value Ref Range   Color, Urine YELLOW YELLOW   APPearance HAZY (A) CLEAR   Specific Gravity, Urine 1.024 1.005 - 1.030   pH 5.0 5.0 - 8.0   Glucose, UA NEGATIVE NEGATIVE mg/dL   Hgb urine dipstick NEGATIVE NEGATIVE   Bilirubin Urine NEGATIVE NEGATIVE   Ketones, ur NEGATIVE NEGATIVE mg/dL   Protein, ur NEGATIVE NEGATIVE mg/dL   Nitrite POSITIVE (A) NEGATIVE   Leukocytes,Ua MODERATE (A) NEGATIVE   RBC / HPF 0-5 0 - 5 RBC/hpf   WBC, UA 21-50 0 - 5 WBC/hpf   Bacteria, UA MANY (A) NONE SEEN   Squamous Epithelial / HPF 0-5 0 - 5 /HPF   Mucus PRESENT   Wet  prep, genital   Collection Time: 04/10/24 11:19 PM  Result Value Ref Range   Yeast Wet Prep HPF POC NONE SEEN NONE SEEN   Trich, Wet Prep NONE SEEN NONE SEEN   Clue Cells Wet Prep HPF POC PRESENT (A) NONE SEEN   WBC, Wet Prep HPF POC <10 <10   Sperm NONE SEEN   CBC   Collection Time: 04/10/24 11:52 PM  Result Value Ref Range   WBC 8.5 4.0 - 10.5 K/uL   RBC 4.70 3.87 - 5.11 MIL/uL   Hemoglobin 14.7 12.0 - 15.0 g/dL   HCT 56.3 63.9 - 53.9 %   MCV 92.8 80.0 - 100.0 fL   MCH 31.3 26.0 - 34.0 pg   MCHC 33.7 30.0 - 36.0 g/dL   RDW 87.2 88.4 - 84.4 %   Platelets 254 150 - 400 K/uL   nRBC 0.0 0.0 - 0.2 %  hCG, quantitative, pregnancy   Collection Time: 04/10/24 11:52 PM  Result Value Ref Range   hCG, Beta Chain, Quant, S 23,621 (H) <5 mIU/mL    Imaging Studies: US  OB LESS THAN 14 WEEKS WITH OB TRANSVAGINAL Result Date: 04/11/2024 CLINICAL DATA:  History of right ectopic pregnancy. Status post right oophorectomy. Abdominal pain. Last menstrual period 01/19/2024. Gestational age by last menstrual fluid 11 weeks and 5 days. EXAM: OBSTETRIC <14 WK US  AND TRANSVAGINAL OB US  TECHNIQUE: Both transabdominal and transvaginal ultrasound examinations were performed for complete evaluation of the gestation as well as the maternal uterus, adnexal regions, and pelvic cul-de-sac.  Transvaginal technique was performed to assess early pregnancy. COMPARISON:  None Available. FINDINGS: Intrauterine gestational sac: None Intrauterine yolk sac:  Not Visualized. Intrauterine Embryo: Not Visualized. Maternal uterus/adnexae: Right oophorectomy. Left ovary is unremarkable. There is a left adnexal ectopic pregnancy (adjacent to the ovary) with associated gestational sac, yolk sac, embryo, and cardiac activity. Ectopic pregnancy with CRL of 5.5 mm which correlates to gestational age of [redacted] weeks and 5 days. The ectopic pregnancy measures 3 x 2.5 x 2.9 cm. Cardiac activity 135 beats per minute. Other: Trace simple free fluid. 4 mm cystic change within the endometrium. IMPRESSION: 1. Unruptured live left adnexal ectopic pregnancy with gestational age by ultrasound of 6 weeks and 5 days. Ectopic pregnancy measures up to 3 cm. 2. A 4 mm cystic change within the endometrium. 3. Study was dictated once all images were available in PACS. Recommend follow-up ultrasound in 7-10 days to further evaluate. These results will be called to the ordering clinician or representative by the Radiologist Assistant, and communication documented in the PACS or [employer]. Electronically Signed   By: Morgane  Naveau M.D.   On: 04/11/2024 01:01    Assessment: Principal Problem:   Viable adnexal ectopic pregnancy Active Problems:   Endometriosis   Pelvic adhesive disease   UTI (urinary tract infection)   Bacterial vaginitis   Plan:  Proposed surgery: Operative laparoscopy, removal of fallopian tube with ectopic pregnancy, possible removal of ovary, possible laparotomy, lysis of adhesions.  Discussed proposed surgical management with patient, reviewed previous operative report and history of extensive pelvic adhesive disease.  She is currently hemodynamically stable, ectopic pregnancy is unruptured at this time.  Patient desires trial of operative laparoscopy if possible, but understands the high risk of  possible laparotomy.  Also understands risk of losing remaining ovary and being surgically menopausal, which will require hormone replacement therapy.    Given the complexity of this case, I made the decision not to attempt this in  the middle of the night (around 0200) unless she was unstable.  And patient was counseled that if there was concern about her being unstable, I would proceed with laparotomy as I am concerned about the difficulty of the laparoscopy approach.    For now, she will be admitted and be observed  closely. She has been NPO since 11:30 pm 04/10/24, and will remain NPO. IV fluids ordered.  Hemoglobin was 14,7, will be rechecked later this morning.  Main OR called and her case was posted for later in the morning.  Will discuss this with my partners during the daytime, and they will proceed with appropriate surgical management knowing that there will be help available in case of any complications.  The risks of surgery were discussed in detail with the patient including but not limited to: bleeding which may require transfusion or reoperation; infection which may require antibiotics; injury to surrounding organs which may involve bowel, bladder, ureters; need for additional procedures including laparotomy or subsequent procedures secondary to abnormal pathology; thromboembolic phenomenon, surgical site problems and other postoperative/anesthesia complications. Likelihood of success in alleviating the patient's condition was discussed. Routine postoperative instructions will be reviewed with the patient and her family in detail after surgery.  The patient concurred with the proposed plan, giving informed written consent for the surgery.  Preoperative orders placed.  Patient will be observed on the [facility] unit until her surgery time.  To OR when ready.  Of note, after surgery, she will be treated for BV, the Cefotetan  that is ordered for SCIP will cover UTI pathogens and urine culture results  will be followed up and managed accordingly.    GLORIS HUGGER, MD, FACOG Obstetrician & Gynecologist, [facility] for [employer], [facility]

## 2024-04-11 NOTE — Anesthesia Preprocedure Evaluation (Signed)
 Anesthesia Evaluation  Patient identified by MRN, date of birth, ID band Patient awake    Reviewed: Allergy & Precautions, NPO status , Patient's Chart, lab work & pertinent test results  Airway Mallampati: II  TM Distance: >3 FB Neck ROM: Full    Dental no notable dental hx. (+) Dental Advisory Given, Teeth Intact   Pulmonary neg pulmonary ROS, former smoker   Pulmonary exam normal        Cardiovascular negative cardio ROS Normal cardiovascular exam   1. Left ventricular ejection fraction, by visual estimation, is 60 to 65%. The left ventricle has normal function. There is no left ventricular hypertrophy. 2. The left ventricle has no regional wall motion abnormalities. 3. Global right ventricle has normal systolic function.The right ventricular size is normal. No increase in right ventricular wall thickness. 4. Left atrial size was normal. 5. Right atrial size was normal. 6. The mitral valve is normal in structure. No evidence of mitral valve regurgitation. No evidence of mitral stenosis. 7. The tricuspid valve is normal in structure. 8. The aortic valve is normal in structure. Aortic valve regurgitation is not visualized. No evidence of aortic valve sclerosis or stenosis. 9. The pulmonic valve was normal in structure. Pulmonic valve regurgitation is not visualized. 10. Normal pulmonary artery systolic pressure. 11. The inferior vena cava is dilated in size with >50% respiratory variability, suggesting right atrial pressure of 8 mmHg.   Neuro/Psych negative neurological ROS     GI/Hepatic Neg liver ROS,GERD  ,,  Endo/Other  negative endocrine ROS    Renal/GU negative Renal ROS     Musculoskeletal negative musculoskeletal ROS (+)    Abdominal   Peds  Hematology  (+) Blood dyscrasia, anemia   Anesthesia Other Findings   Reproductive/Obstetrics                              Anesthesia  Physical Anesthesia Plan  ASA: 2  Anesthesia Plan: General   Post-op Pain Management: Tylenol  PO (pre-op)*, Celebrex PO (pre-op)* and Minimal or no pain anticipated   Induction: Intravenous  PONV Risk Score and Plan: 4 or greater and Ondansetron , Dexamethasone , Scopolamine  patch - Pre-op and Midazolam   Airway Management Planned: Oral ETT  Additional Equipment: None  Intra-op Plan:   Post-operative Plan: Extubation in OR  Informed Consent: I have reviewed the patients History and Physical, chart, labs and discussed the procedure including the risks, benefits and alternatives for the proposed anesthesia with the patient or authorized representative who has indicated his/her understanding and acceptance.     Dental advisory given  Plan Discussed with: Anesthesiologist and CRNA  Anesthesia Plan Comments: (  )         Anesthesia Quick Evaluation

## 2024-04-11 NOTE — Anesthesia Procedure Notes (Signed)
 Procedure Name: Intubation Date/Time: 04/11/2024 2:09 PM  Performed by: Cindie Donald CROME, CRNAPre-anesthesia Checklist: Patient identified, Emergency Drugs available, Suction available and Patient being monitored Patient Re-evaluated:Patient Re-evaluated prior to induction Oxygen Delivery Method: Circle System Utilized Preoxygenation: Pre-oxygenation with 100% oxygen Induction Type: IV induction Ventilation: Mask ventilation without difficulty Laryngoscope Size: Mac and 3 Grade View: Grade I Tube type: Oral Tube size: 6.5 mm Number of attempts: 1 Airway Equipment and Method: Stylet Placement Confirmation: ETT inserted through vocal cords under direct vision, positive ETCO2 and breath sounds checked- equal and bilateral Secured at: 20 cm Tube secured with: Tape Dental Injury: Teeth and Oropharynx as per pre-operative assessment

## 2024-04-12 ENCOUNTER — Ambulatory Visit: Payer: Self-pay | Admitting: Obstetrics & Gynecology

## 2024-04-12 ENCOUNTER — Encounter (HOSPITAL_COMMUNITY): Payer: Self-pay | Admitting: Obstetrics & Gynecology

## 2024-04-12 DIAGNOSIS — N3001 Acute cystitis with hematuria: Secondary | ICD-10-CM

## 2024-04-12 DIAGNOSIS — N3000 Acute cystitis without hematuria: Secondary | ICD-10-CM

## 2024-04-12 LAB — SURGICAL PATHOLOGY

## 2024-04-12 MED ORDER — NITROFURANTOIN MONOHYD MACRO 100 MG PO CAPS: 100.0000 mg | ORAL_CAPSULE | Freq: Two times a day (BID) | ORAL | 0 refills | Status: AC

## 2024-04-12 NOTE — Discharge Summary (Signed)
 Physician Discharge Summary  Patient ID: Stacey Ramos MRN: [id number] DOB/AGE: [birth year] 43 y.o.  Admit date: 04/10/2024 Discharge date: 04/11/2024  Admission Diagnoses: Ectopic pregnancy  Discharge Diagnoses:  Principal Problem:   Viable adnexal ectopic pregnancy Active Problems:   Endometriosis   Pelvic adhesive disease   UTI (urinary tract infection)   Bacterial vaginitis   Discharged Condition: stable  Hospital Course: 43 year old G3 P1-0-1-1 admitted due to ectopic pregnancy.She reported having a lot of abdominal pain on 7/20/225 and spotting, LMP 02/18/24.  She took a test at home which was positive, and she came here for further evaluation.  Workup completed in [facility] and confirmed ectopic pregnancy.  Of note patient has a history significant for endometriosis with prior right salpingo-oophorectomy due to an ectopic pregnancy and dense pelvic adhesions.  At the time of admission, patient was stable plan to proceed with surgical intervention once appropriate surgical resources could be obtained.  She then underwent a robotic assisted laparoscopic left salpingectomy with removal of ectopic.  Please see operative note for further details.  Her postoperative course was uncomplicated and she was discharged home same day.  Consults: None  Significant Diagnostic Studies: labs:     Latest Ref Rng & Units 04/11/2024    2:30 AM 04/10/2024   11:52 PM 04/10/2022    4:48 PM  CBC  WBC 4.0 - 10.5 K/uL 8.5  8.5  9.4   Hemoglobin 12.0 - 15.0 g/dL 84.9  85.2  85.8   Hematocrit 36.0 - 46.0 % 44.6  43.6  42.5   Platelets 150 - 400 K/uL 263  254  280       Treatments: IV hydration, antibiotics: Cefazolin, and analgesia: acetaminophen , ibuprofen, oxycodone   Discharge Exam: Blood pressure (!) 96/49, pulse 70, temperature 97.9 F (36.6 C), resp. rate (!) 24, height 5' 4 (1.626 m), weight 90 kg, last menstrual period 02/18/2024, SpO2 98%, unknown if currently breastfeeding. General  appearance: alert, cooperative, and no distress  Disposition: Discharge disposition: 01-Home or Self Care       Discharge Instructions     Discharge patient   Complete by: As directed    Discharge disposition: 01-Home or Self Care   Discharge patient date: 04/11/2024      Allergies as of 04/11/2024   No Known Allergies      Medication List     TAKE these medications    acetaminophen  325 MG tablet Commonly known as: Tylenol  Take 2 tablets (650 mg total) by mouth every 6 (six) hours as needed.   Calcium  Citrate-Vitamin D 500-500 MG-UNT/5GM Powd Take by mouth.   diclofenac  Sodium 1 % Gel Commonly known as: Voltaren  Apply 2 g topically 4 (four) times daily.   docusate sodium  100 MG capsule Commonly known as: Colace Take 1 capsule (100 mg total) by mouth 2 (two) times daily.   FLINTSTONES COMPLETE PO Take 2 tablets by mouth daily.   Folic Acid 5 MG Caps   lisdexamfetamine 70 MG capsule Commonly known as: VYVANSE  Take 70 mg by mouth daily.   multivitamin Chew chewable tablet Chew 2 tablets by mouth daily.   norethindrone  5 MG tablet Commonly known as: Aygestin  Take 1 tablet (5 mg total) by mouth daily. Take with painful and heavy cycles   ondansetron  4 MG disintegrating tablet Commonly known as: ZOFRAN -ODT Take 1 tablet (4 mg total) by mouth every 8 (eight) hours as needed for nausea or vomiting.   oxyCODONE  5 MG immediate release tablet Commonly known as: Oxy IR/ROXICODONE  Take 1  tablet (5 mg total) by mouth every 6 (six) hours as needed for up to 7 days for severe pain (pain score 7-10) or breakthrough pain.   VITAMIN B12 PO Take 1 tablet by mouth daily.   Vitamin D (Ergocalciferol) 1.25 MG (50000 UNIT) Caps capsule Commonly known as: DRISDOL Take 50,000 Units by mouth once a week.        Follow-up Information     [facility]. Go in 2 week(s).   Specialty: Obstetrics and Gynecology Why: A  follow up appointment will be scheduled for you in 1-2 weeks Contact information: [address] [place]  [id number] [phone]                Signed: Delon CHRISTELLA Prude 04/12/2024, 2:47 PM

## 2024-04-13 LAB — URINE CULTURE: Culture: >=100000 — AB

## 2024-04-13 MED ORDER — CEFADROXIL 500 MG PO CAPS
500.0000 mg | ORAL_CAPSULE | Freq: Two times a day (BID) | ORAL | 0 refills | Status: AC
Start: 2024-04-13 — End: ?

## 2024-04-27 ENCOUNTER — Ambulatory Visit: Payer: Self-pay | Admitting: Obstetrics & Gynecology
